# Patient Record
Sex: Female | Born: 1955
Health system: Southern US, Community
[De-identification: ages and names within clinical notes are randomized; demographics above are authoritative.]

## PROBLEM LIST (undated history)

## (undated) DIAGNOSIS — T7840XA Allergy, unspecified, initial encounter: Secondary | ICD-10-CM

## (undated) DIAGNOSIS — G4730 Sleep apnea, unspecified: Secondary | ICD-10-CM

## (undated) DIAGNOSIS — I10 Essential (primary) hypertension: Secondary | ICD-10-CM

## (undated) DIAGNOSIS — F329 Major depressive disorder, single episode, unspecified: Secondary | ICD-10-CM

## (undated) DIAGNOSIS — F419 Anxiety disorder, unspecified: Secondary | ICD-10-CM

## (undated) DIAGNOSIS — K219 Gastro-esophageal reflux disease without esophagitis: Secondary | ICD-10-CM

## (undated) DIAGNOSIS — F32A Depression, unspecified: Secondary | ICD-10-CM

## (undated) DIAGNOSIS — E785 Hyperlipidemia, unspecified: Secondary | ICD-10-CM

## (undated) DIAGNOSIS — E079 Disorder of thyroid, unspecified: Secondary | ICD-10-CM

## (undated) HISTORY — DX: Anxiety disorder, unspecified: F41.9

## (undated) HISTORY — PX: POLYPECTOMY: SHX149

## (undated) HISTORY — DX: Allergy, unspecified, initial encounter: T78.40XA

## (undated) HISTORY — DX: Essential (primary) hypertension: I10

## (undated) HISTORY — DX: Major depressive disorder, single episode, unspecified: F32.9

## (undated) HISTORY — DX: Gastro-esophageal reflux disease without esophagitis: K21.9

## (undated) HISTORY — PX: ABDOMINAL HYSTERECTOMY: SHX81

## (undated) HISTORY — DX: Disorder of thyroid, unspecified: E07.9

## (undated) HISTORY — DX: Sleep apnea, unspecified: G47.30

## (undated) HISTORY — DX: Hyperlipidemia, unspecified: E78.5

## (undated) HISTORY — DX: Depression, unspecified: F32.A

---

## 1998-11-02 ENCOUNTER — Other Ambulatory Visit: Admission: RE | Admit: 1998-11-02 | Discharge: 1998-11-02 | Payer: Self-pay | Admitting: Obstetrics and Gynecology

## 2000-04-09 ENCOUNTER — Encounter: Payer: Self-pay | Admitting: Emergency Medicine

## 2000-04-09 ENCOUNTER — Emergency Department (HOSPITAL_COMMUNITY): Admission: EM | Admit: 2000-04-09 | Discharge: 2000-04-09 | Payer: Self-pay | Admitting: Emergency Medicine

## 2000-04-10 ENCOUNTER — Emergency Department (HOSPITAL_COMMUNITY): Admission: EM | Admit: 2000-04-10 | Discharge: 2000-04-11 | Payer: Self-pay | Admitting: Emergency Medicine

## 2001-04-04 ENCOUNTER — Emergency Department (HOSPITAL_COMMUNITY): Admission: EM | Admit: 2001-04-04 | Discharge: 2001-04-05 | Payer: Self-pay | Admitting: Emergency Medicine

## 2001-05-14 ENCOUNTER — Other Ambulatory Visit: Admission: RE | Admit: 2001-05-14 | Discharge: 2001-05-14 | Payer: Self-pay | Admitting: Internal Medicine

## 2001-05-27 ENCOUNTER — Encounter: Payer: Self-pay | Admitting: Internal Medicine

## 2001-05-27 ENCOUNTER — Encounter: Admission: RE | Admit: 2001-05-27 | Discharge: 2001-05-27 | Payer: Self-pay | Admitting: Internal Medicine

## 2001-11-30 ENCOUNTER — Inpatient Hospital Stay (HOSPITAL_COMMUNITY): Admission: AD | Admit: 2001-11-30 | Discharge: 2001-12-02 | Payer: Self-pay | Admitting: Internal Medicine

## 2001-12-01 ENCOUNTER — Encounter: Payer: Self-pay | Admitting: Internal Medicine

## 2001-12-02 ENCOUNTER — Encounter: Payer: Self-pay | Admitting: Cardiology

## 2002-05-28 ENCOUNTER — Encounter: Payer: Self-pay | Admitting: Internal Medicine

## 2002-05-28 ENCOUNTER — Encounter: Admission: RE | Admit: 2002-05-28 | Discharge: 2002-05-28 | Payer: Self-pay | Admitting: Internal Medicine

## 2002-07-15 HISTORY — PX: COLONOSCOPY: SHX174

## 2003-05-02 ENCOUNTER — Ambulatory Visit (HOSPITAL_COMMUNITY): Admission: RE | Admit: 2003-05-02 | Discharge: 2003-05-02 | Payer: Self-pay | Admitting: Gastroenterology

## 2003-05-31 ENCOUNTER — Encounter: Admission: RE | Admit: 2003-05-31 | Discharge: 2003-05-31 | Payer: Self-pay | Admitting: Internal Medicine

## 2003-10-26 ENCOUNTER — Emergency Department (HOSPITAL_COMMUNITY): Admission: EM | Admit: 2003-10-26 | Discharge: 2003-10-26 | Payer: Self-pay | Admitting: Family Medicine

## 2003-10-30 ENCOUNTER — Emergency Department (HOSPITAL_COMMUNITY): Admission: AD | Admit: 2003-10-30 | Discharge: 2003-10-30 | Payer: Self-pay | Admitting: Family Medicine

## 2004-03-10 ENCOUNTER — Emergency Department (HOSPITAL_COMMUNITY): Admission: EM | Admit: 2004-03-10 | Discharge: 2004-03-11 | Payer: Self-pay | Admitting: Emergency Medicine

## 2004-06-19 ENCOUNTER — Encounter: Admission: RE | Admit: 2004-06-19 | Discharge: 2004-06-19 | Payer: Self-pay | Admitting: Internal Medicine

## 2005-02-22 ENCOUNTER — Ambulatory Visit: Payer: Self-pay | Admitting: Internal Medicine

## 2005-02-22 ENCOUNTER — Ambulatory Visit (HOSPITAL_COMMUNITY): Admission: RE | Admit: 2005-02-22 | Discharge: 2005-02-22 | Payer: Self-pay | Admitting: Gastroenterology

## 2005-03-06 ENCOUNTER — Ambulatory Visit: Payer: Self-pay | Admitting: Gastroenterology

## 2005-04-23 ENCOUNTER — Ambulatory Visit: Payer: Self-pay | Admitting: Internal Medicine

## 2005-07-03 ENCOUNTER — Encounter: Admission: RE | Admit: 2005-07-03 | Discharge: 2005-07-03 | Payer: Self-pay | Admitting: Internal Medicine

## 2006-08-01 ENCOUNTER — Encounter: Admission: RE | Admit: 2006-08-01 | Discharge: 2006-08-01 | Payer: Self-pay | Admitting: Internal Medicine

## 2007-08-04 ENCOUNTER — Encounter: Admission: RE | Admit: 2007-08-04 | Discharge: 2007-08-04 | Payer: Self-pay | Admitting: Internal Medicine

## 2007-11-15 ENCOUNTER — Emergency Department (HOSPITAL_COMMUNITY): Admission: EM | Admit: 2007-11-15 | Discharge: 2007-11-15 | Payer: Self-pay | Admitting: Emergency Medicine

## 2008-08-23 ENCOUNTER — Encounter: Admission: RE | Admit: 2008-08-23 | Discharge: 2008-08-23 | Payer: Self-pay | Admitting: Internal Medicine

## 2009-08-24 ENCOUNTER — Encounter: Admission: RE | Admit: 2009-08-24 | Discharge: 2009-08-24 | Payer: Self-pay | Admitting: Internal Medicine

## 2010-08-08 ENCOUNTER — Other Ambulatory Visit: Payer: Self-pay | Admitting: Internal Medicine

## 2010-08-08 DIAGNOSIS — Z1239 Encounter for other screening for malignant neoplasm of breast: Secondary | ICD-10-CM

## 2010-09-12 ENCOUNTER — Ambulatory Visit
Admission: RE | Admit: 2010-09-12 | Discharge: 2010-09-12 | Disposition: A | Discharge status: Home / Self Care | Payer: BC Managed Care – PPO | Source: Ambulatory Visit | Attending: Internal Medicine | Admitting: Internal Medicine

## 2010-09-12 DIAGNOSIS — Z1239 Encounter for other screening for malignant neoplasm of breast: Secondary | ICD-10-CM

## 2010-11-30 NOTE — Consult Note (Signed)
[place]. [facility]  Patient:    Tamara West, Tamara West                      MRN: [id number] Proc. Date: 04/09/00 Attending:  Genene Churn. Love, M.D.                          Consultation Report  PATIENT ADDRESS:  [address] [place], [place] [address].  DATE OF BIRTH:  February 16, 1956  REASON FOR CONSULTATION:  This 55 year old right-handed black married female has a three-year history of four or five episodes per year of lying down and developing left arm and leg numbness.  I am asked to see her for evaluation of the recurrent numbness.  HISTORY OF PRESENT ILLNESS:  Tamara West has a known history of high blood pressure and discontinued her antihypertensive medications approximately four months ago.  She had been followed by Dr. Andi Devon at that time.  Last night, she was boiling a chicken and used Mahatma rice with vermicelli in it. She noted the onset of hives and had itching and raised skin lesions during the night.  This morning, she took a Benadryl, became sleepy and laid down, and after she got up, she had left arm and left leg numbness without West numbness, headache, trunk, or chest numbness.  The symptoms resolved in approximately 30 minutes after she was walking around.  She noted no weakness. There has been no history of amaurosis fugax, double vision, hiccups, swallowing problems, etc.  She has no known history of migraine headaches with the episodes, but states that she has had some headaches.  She denies any other serious neurologic condition.  She is currently not taking any medications.  PAST MEDICAL HISTORY:  Significant for panic attacks, depression, and longstanding hypertension.  PHYSICAL EXAMINATION:  GENERAL:  Revealed a well-developed, pleasant, black female in no acute distress.  VITAL SIGNS:  Blood pressure sitting right and left arm 160/80 and 150/80 with a heart rate of 84 and regular.  No bruits were  heard.  NECK:  Supple.  NEUROLOGIC:  Mental status revealed she was alert, oriented x 3, and followed three-step commands.  Her cranial nerve examination revealed visual fields to be full, the disks are flat, the extraocular movements are full, and corneals are present.  There was no facial motor asymmetry.  Hearing was present with air conduction greater than bone conduction.  Tongue was midline, the uvula was midline, gags were present.  Sternocleidomastoid and trapezius and all motor examination revealed good strength in the upper and lower extremities without any evidence of drift.  Her coordination testing revealed finger-to-nose and heel-to-shin to be well done.  The deep tendon reflexes were 2+ and plantar responses were downgoing.  LABORATORY DATA:  Revealed a glucose of 107, BUN 9, sodium 142, potassium 3.8, chloride 107, CO2 content 24.  Hemoglobin and hematocrit were 14.9 and 42% respectively.  IMPRESSION: 1. Recurrent transient left-sided numbness, occurring four to five times per    year over the last three years, possibly representing migraine phenomenon,    code 782.0. 2. Allergy with hives. 3. Hypertension, code 796.2.  PLAN:  The plan at this time is to obtain a CT scan without contrast enhancement and consider aspirin therapy, depending on the above. DD:  04/09/00 TD:  04/10/00 Job: [id number]

## 2010-11-30 NOTE — Op Note (Signed)
   NAMEJOSSALYN, Tamara West                         ACCOUNT NO.:  [id number]   MEDICAL RECORD NO.:  [id number]                   PATIENT TYPE:  AMB   LOCATION:  ENDO                                 FACILITY:  [facility]   PHYSICIAN:  Anselmo Rod, M.D.               DATE OF BIRTH:  07-28-1955   DATE OF PROCEDURE:  05/02/2003  DATE OF DISCHARGE:                                 OPERATIVE REPORT   PROCEDURE:  Screening colonoscopy.   ENDOSCOPIST:  Anselmo Rod, M.D.   INSTRUMENT USED:  Olympus video colonoscope.   INDICATIONS FOR PROCEDURE:  A 55 year old African-American female with a  history of bright red blood per rectum, rule out colonic polyps, masses,  hemorrhoids, etc.   PREPROCEDURE PREPARATION:  Informed consent was procured from the patient.  The patient was fasted for eight hours prior to the procedure and prepped  with a bottle of magnesium citrate and a gallon of GoLYTELY the night prior  to the procedure.   PREPROCEDURE PHYSICAL EXAMINATION:  VITAL SIGNS: Stable.  NECK:  Supple.  CHEST:  Clear to auscultation. S1 and S2 regular.  ABDOMEN:  Soft with normal bowel sounds.   DESCRIPTION OF PROCEDURE:  The patient was placed in the left lateral  decubitus position and sedated with 70 mg of Demerol and 7 mg of Versed  intravenously.  Once the patient was adequately sedated and maintained on  low flow oxygen and continuous cardiac monitoring, the Olympus video  colonoscope was advanced from the rectum to the cecum and terminal ileum.  There was some residual stool in the colon.  Multiple washings were done.  No masses, polyps, erosions, ulcerations, or diverticula were seen.  Small  internal hemorrhoids were appreciated on retroflexion in the rectum.  The  examination was normal up to the terminal ileum.  The appendiceal orifice  and ileocecal valve were clearly visualized and photographed.   IMPRESSION:  1. Normal colonoscopy up to the terminal ileum except for small  internal     hemorrhoids.  2. No masses, polyps, or diverticula were seen.    RECOMMENDATIONS:  Continue high fiber diet with liberal fluid intake.  Repeat CRC screening in the next 10 years unless the patient develops any  abnormal symptoms in the interim.  Outpatient follow-up in the next two  weeks or earlier if needed.                                               Anselmo Rod, M.D.    JNM/MEDQ  D:  05/02/2003  T:  05/02/2003  Job:  [id number]   cc:   Merlene Laughter. Renae Gloss, M.D.  [address]  [address]  [place]  [place] [address]  Fax: [phone]

## 2010-11-30 NOTE — Discharge Summary (Signed)
[place]. [facility]  Patient:    Tamara West, Tamara West                      MRN: [id number] Attending:  Genene Churn. Love, M.D.                           Discharge Summary  DATE OF BIRTH:  17-Jun-1956.  ADDENDUM:  IMAGING STUDIES:  CT scan of the brain without contrast enhancement showed no definite abnormality.  IMPRESSION: 1. Paresthesia of unknown etiology, code 782.0. 2. Allergy reaction.  PLAN:  Follow her up as an outpatient.  She is to take one aspirin a day and restart her antihypertensive medications, which she discontinued.  The possibility of sensory seizure is raised or demyelinating disorder, but both seem unlikely.  Plan is to follow up as an outpatient. DD:  04/09/00 TD:  04/10/00 Job: [id number]

## 2010-11-30 NOTE — Procedures (Signed)
[place]  Patient:    Tamara West, OSORTO Visit Number: [id number] MRN: [id number]          Service Type: MED Location: 2000 2004 01 Attending Physician:  Andi Devon Dictated by:   Sherral Hammers, M.D. Proc. Date: 12/02/01 Admit Date:  11/30/2001 Discharge Date: 12/02/2001   CC:         Cala Bradford R. Renae Gloss, M.D.   Stress Test  REFERRING PHYSICIAN:  Merlene Laughter. Renae Gloss, M.D.  INDICATIONS:  [name] was admitted to [facility] with complaints of chest discomfort and palpitations.  She subsequently had negative cardiac enzymes and in view of her risk factors of hypertension and hyperlipidemia, she is referred for nuclear stress test today.  PERSANTINE TECHNETIUM--45m CARDIOLITE MYOCARDIAL PERFUSION STUDY:  The patient performed the standard rest/pharmacologic stress protocol.  ELECTROCARDIOGRAPHIC/HEMODYNAMIC DATA:  The resting blood pressure was 153/96 with a resting pulse of 92 beats per minute.  Blood pressure decreased to a nadir of 123/80.  The patient did complain of some head tingling with chest pressure which resolved with administration of IV aminophylline per protocol. Baseline 12-lead electrocardiogram revealed normal sinus rhythm with nonspecific ST changes.  She did experience some mild (less than 1 mm) ST depression inferiorly and anterolaterally.  She tolerated the procedure well without immediate complications.  IMPRESSION: 1. EKG negative for ischemia. 2. Cineangiographic images are pending.Dictated by:   Sherral Hammers, M.D.  Attending Physician:  Andi Devon DD:  12/02/01 TD:  12/04/01 Job: [id number]

## 2010-11-30 NOTE — H&P (Signed)
[place]. [facility]  Patient:    Tamara West, Tamara West Visit Number: [id number] MRN: [id number]          Service Type: MED Location: 2000 2004 01 Attending Physician:  Alva Garnet. Dictated by:   Merlene Laughter Renae Gloss, M.D. Admit Date:  11/30/2001                           History and Physical  CHIEF COMPLAINT:  Chest pain.  HISTORY OF PRESENT ILLNESS:  Ms. Tamara West is a 55 year old lady who presents today with intermittent episodes of chest pain.  She also reports association of heart palpitations and numbness in left arm.  She denies shortness of breath, nausea, vomiting, or diaphoresis with her symptoms, however.  She has no other acute constitutional or systemic complaints other than occasional leg nocturnal leg cramps.  ALLERGIES:  PENICILLIN which causes swelling.  MEDICATIONS:  Maxzide 37.5/25 one p.o. q.d.  PAST MEDICAL HISTORY:  Hypertension, hyperlipidemia.  FAMILY HISTORY:  Significant for heart attack in father who is deceased, hypertension in father and in siblings, stomach cancer and diabetes in grandmother.  SOCIAL HISTORY:  Ms. Tamara West is married and is employed as a Surveyor, mining.  She denies tobacco, alcohol, or drugs of abuse.  REVIEW OF SYSTEMS:  As per patient history assessment and HPI, otherwise negative.  Greater than 10 systems are reviewed.  PHYSICAL EXAMINATION  GENERAL:  Well-developed, well-nourished black female in no acute distress.  VITAL SIGNS:  Blood pressure 130/80, pulse 76, temperature 98.9, respirations 20.  HEENT:  TMs within normal limits bilaterally.  No oropharyngeal lesions. PERRLA.  NECK:  Supple.  No masses.  Carotids 2+.  No bruits.  LUNGS:  Clear to auscultation bilaterally.  HEART:  S1, S2.  Regular rate, rhythm.  No murmur, rub, or gallop.  ABDOMEN:  Soft, nontender, nondistended.  Positive bowel sounds.  EXTREMITIES:  No clubbing, cyanosis, edema.  Pulses  2+ throughout.  NEUROLOGIC:  Alert and oriented x3.  Cranial nerves intact.  ASSESSMENT AND PLAN: 1. Chest pain with palpitations.  Ms. Tamara West has several cardiac risk    factors including hypertension, postmenopause, family history, and    hyperlipidemia.  Her symptoms are concerning for angina.  She will be    admitted to telemetry and observed closely.  Serial cardiac enzymes will be    obtained as well.  If her enzymes are unremarkable, a Cardiolite stress    test will be obtained.  However, if she rules in for myocardial infarction,    of course, a cardiology consult will be requested.  Thyroid disease may be    a possible etiology of her palpitations.  Thyroid function tests will be    obtained during this admission.  Her symptoms may be exacerbated by    anxiety. 2. Hypertension.  This has been well controlled.  She will continue with her    outpatient medical regimen. 3. Hyperlipidemia.  A fasting lipid panel will be obtained.  Ms. Tamara West was    unable to tolerate Lipitor.  She was started on Zocor several months ago.    She states that she has been compliant with this medication.  Her    anticholesterol medication may need adjustment during this admission. Dictated by:   Merlene Laughter Renae Gloss, M.D. Attending Physician:  Andi Devon R. DD:  11/30/01 TD:  12/01/01 Job: [id number]

## 2010-11-30 NOTE — Discharge Summary (Signed)
[place]. [facility]  Patient:    Tamara West, Tamara West Visit Number: [id number] MRN: [id number]          Service Type: MED Location: 2000 2004 01 Attending Physician:  Alva Garnet. Dictated by:   Merlene Laughter Renae Gloss, M.D. Admit Date:  11/30/2001 Discharge Date: 12/02/2001                             Discharge Summary  DISCHARGE DIAGNOSES: 1. Chest pain. 2. Hypertension. 3. Hyperlipidemia. 4. Palpitations.  HISTORY AND HOSPITAL COURSE:  The patient was admitted for further evaluation and treatment of mid sternal chest pain associated with heart palpitations and numbness of her left arm.  Her telemetry readings were normal sinus rhythm throughout her admission on electrocardiogram and furthermore did not show any acute ischemic changes, however given her risk factors a cardiac consultation was obtained.  The patient ruled out for a myocardial infarction and a Persantine Cardiolite test was unremarkable.  It is unlikely that the etiology for the patients chest pain is cardiac-related.  Other concerns would include a gastrointestinal cause such as acid peptic disease or musculoskeletal source such as costochondritis or muscle strain.  The patient had no further chest pain throughout her hospitalization.  Other concerns raised during the patients admission were low blood pressure readings as well as hyperkalemia.  It was thought that this was secondary to her Maxzide.  Her Maxzide was discontinued, however, she will continue taking her hydrochlorothiazide 12.5 mg p.o. q.d.  DISCHARGE MEDICATIONS: 1. Hydrochlorothiazide 25 mg tablets, one half p.o. q.d. 2. Aspirin 325 mg one p.o. q.d.  FOLLOW UP:  The patient will be seen by Dr. Renae Gloss within two weeks following discharge. Dictated by:   Merlene Laughter Renae Gloss, M.D. Attending Physician:  Andi Devon R. DD:  01/07/02 TD:  01/08/02 Job: [phone] [id number]

## 2011-09-24 ENCOUNTER — Other Ambulatory Visit: Payer: Self-pay | Admitting: Internal Medicine

## 2011-09-24 DIAGNOSIS — Z1231 Encounter for screening mammogram for malignant neoplasm of breast: Secondary | ICD-10-CM

## 2011-10-07 ENCOUNTER — Ambulatory Visit
Admission: RE | Admit: 2011-10-07 | Discharge: 2011-10-07 | Disposition: A | Discharge status: Home / Self Care | Payer: BC Managed Care – PPO | Source: Ambulatory Visit | Attending: Internal Medicine | Admitting: Internal Medicine

## 2011-10-07 DIAGNOSIS — Z1231 Encounter for screening mammogram for malignant neoplasm of breast: Secondary | ICD-10-CM

## 2012-01-21 ENCOUNTER — Ambulatory Visit (INDEPENDENT_AMBULATORY_CARE_PROVIDER_SITE_OTHER): Payer: BC Managed Care – PPO | Admitting: Emergency Medicine

## 2012-01-21 VITALS — BP 130/84 | HR 88 | Temp 98.1°F | Resp 16 | Ht 60.5 in | Wt 186.4 lb

## 2012-01-21 DIAGNOSIS — IMO0002 Reserved for concepts with insufficient information to code with codable children: Secondary | ICD-10-CM

## 2012-01-21 DIAGNOSIS — S86919A Strain of unspecified muscle(s) and tendon(s) at lower leg level, unspecified leg, initial encounter: Secondary | ICD-10-CM

## 2012-01-21 MED ORDER — NAPROXEN SODIUM 550 MG PO TABS: 550.0000 mg | ORAL_TABLET | Freq: Two times a day (BID) | ORAL | Status: AC

## 2012-01-21 NOTE — Progress Notes (Signed)
   Date:  01/21/2012   Name:  Tamara West   DOB:  [birth year]   MRN:  [id number]  PCP:  No primary provider on file.    Chief Complaint: Calf Pain   History of Present Illness:  Tamara West is a 56 y.o. very pleasant female patient who presents with the following:  No history of injury. Says she was at church on Sunday and felt a pop in the calf of her right leg.  No overuse.  Since has been limping due to pain and swelling.  Denies risks for DVT.  There is no problem list on file for this patient.  No past medical history on file. No past surgical history on file. History  Substance Use Topics  . Smoking status: Never Smoker   . Smokeless tobacco: Not on file  . Alcohol Use: No   No family history on file. Allergies  Allergen Reactions  . Penicillins Swelling  . Sulfa Antibiotics Other (See Comments)    knots    Medication list has been reviewed and updated.  Current Outpatient Prescriptions on File Prior to Visit  Medication Sig Dispense Refill  . simvastatin (ZOCOR) 40 MG tablet Take 40 mg by mouth every evening.      . valsartan-hydrochlorothiazide (DIOVAN-HCT) 80-12.5 MG per tablet Take 1 tablet by mouth daily.        Review of Systems:  As per HPI, otherwise negative.    Physical Examination: Filed Vitals:   01/21/12 1611  BP: 130/84  Pulse: 88  Temp: 98.1 F (36.7 C)  Resp: 16   Filed Vitals:   01/21/12 1611  Height: 5' 0.5" (1.537 m)  Weight: 186 lb 6.4 oz (84.55 kg)   Body mass index is 35.80 kg/(m^2). Ideal Body Weight: Weight in (lb) to have BMI = 25: 129.9    GEN: WDWN, NAD, Non-toxic, Alert & Oriented x 3 HEENT: Atraumatic, Normocephalic.  Ears and Nose: No external deformity. EXTR: No clubbing/cyanosis/edema NEURO: Normal gait.  PSYCH: Normally interactive. Conversant. Not depressed or anxious appearing.  Calm demeanor.  Right calf tender.  Full ROM lower ext.  No deformity or ecchymosis pulses intact.   Calf muscle soft.  No  edema.  EKG / Labs / Xrays: None available at time of encounter  Assessment and Plan: Muscle strain calf Crutches Heat Anaprox Follow up as needed  Carmelina Dane, MD

## 2012-09-07 ENCOUNTER — Other Ambulatory Visit: Payer: Self-pay | Admitting: Internal Medicine

## 2012-09-07 DIAGNOSIS — Z1231 Encounter for screening mammogram for malignant neoplasm of breast: Secondary | ICD-10-CM

## 2012-10-07 ENCOUNTER — Ambulatory Visit
Admission: RE | Admit: 2012-10-07 | Discharge: 2012-10-07 | Disposition: A | Discharge status: Home / Self Care | Payer: BC Managed Care – PPO | Source: Ambulatory Visit | Attending: Internal Medicine | Admitting: Internal Medicine

## 2012-10-07 DIAGNOSIS — Z1231 Encounter for screening mammogram for malignant neoplasm of breast: Secondary | ICD-10-CM

## 2013-09-01 ENCOUNTER — Other Ambulatory Visit: Payer: Self-pay

## 2013-09-01 DIAGNOSIS — Z1231 Encounter for screening mammogram for malignant neoplasm of breast: Secondary | ICD-10-CM

## 2014-01-25 ENCOUNTER — Encounter (INDEPENDENT_AMBULATORY_CARE_PROVIDER_SITE_OTHER): Payer: Self-pay

## 2014-01-25 ENCOUNTER — Ambulatory Visit
Admission: RE | Admit: 2014-01-25 | Discharge: 2014-01-25 | Disposition: A | Discharge status: Home / Self Care | Payer: BC Managed Care – PPO | Source: Ambulatory Visit

## 2014-01-25 DIAGNOSIS — Z1231 Encounter for screening mammogram for malignant neoplasm of breast: Secondary | ICD-10-CM

## 2014-01-27 ENCOUNTER — Other Ambulatory Visit: Payer: Self-pay | Admitting: Internal Medicine

## 2014-01-27 DIAGNOSIS — R928 Other abnormal and inconclusive findings on diagnostic imaging of breast: Secondary | ICD-10-CM

## 2014-02-03 ENCOUNTER — Ambulatory Visit
Admission: RE | Admit: 2014-02-03 | Discharge: 2014-02-03 | Disposition: A | Discharge status: Home / Self Care | Payer: BC Managed Care – PPO | Source: Ambulatory Visit | Attending: Internal Medicine | Admitting: Internal Medicine

## 2014-02-03 DIAGNOSIS — R928 Other abnormal and inconclusive findings on diagnostic imaging of breast: Secondary | ICD-10-CM

## 2014-07-13 ENCOUNTER — Encounter: Payer: Self-pay | Admitting: Gastroenterology

## 2014-11-06 ENCOUNTER — Ambulatory Visit (INDEPENDENT_AMBULATORY_CARE_PROVIDER_SITE_OTHER): Payer: BC Managed Care – PPO | Admitting: Family Medicine

## 2014-11-06 ENCOUNTER — Ambulatory Visit (INDEPENDENT_AMBULATORY_CARE_PROVIDER_SITE_OTHER): Payer: BC Managed Care – PPO

## 2014-11-06 VITALS — BP 134/80 | HR 99 | Temp 98.5°F | Resp 16 | Ht 62.0 in | Wt 184.8 lb

## 2014-11-06 DIAGNOSIS — R1013 Epigastric pain: Secondary | ICD-10-CM

## 2014-11-06 DIAGNOSIS — B889 Infestation, unspecified: Secondary | ICD-10-CM

## 2014-11-06 DIAGNOSIS — R21 Rash and other nonspecific skin eruption: Secondary | ICD-10-CM

## 2014-11-06 DIAGNOSIS — K5900 Constipation, unspecified: Secondary | ICD-10-CM | POA: Diagnosis not present

## 2014-11-06 LAB — POCT CBC
Granulocyte percent: 82.7 %G — AB (ref 37–80)
HCT, POC: 39.1 % (ref 37.7–47.9)
Hemoglobin: 12.9 g/dL (ref 12.2–16.2)
Lymph, poc: 1.2 (ref 0.6–3.4)
MCH, POC: 28.6 pg (ref 27–31.2)
MCHC: 32.9 g/dL (ref 31.8–35.4)
MCV: 86.8 fL (ref 80–97)
MID (CBC): 0.5 (ref 0–0.9)
MPV: 8.4 fL (ref 0–99.8)
POC Granulocyte: 8.1 — AB (ref 2–6.9)
POC LYMPH PERCENT: 12.6 %L (ref 10–50)
POC MID %: 4.7 %M (ref 0–12)
Platelet Count, POC: 247 10*3/uL (ref 142–424)
RBC: 4.5 M/uL (ref 4.04–5.48)
RDW, POC: 13 %
WBC: 9.8 10*3/uL (ref 4.6–10.2)

## 2014-11-06 LAB — COMPREHENSIVE METABOLIC PANEL
ALK PHOS: 78 U/L (ref 39–117)
ALT: 19 U/L (ref 0–35)
AST: 21 U/L (ref 0–37)
Albumin: 4.1 g/dL (ref 3.5–5.2)
BUN: 10 mg/dL (ref 6–23)
CO2: 22 mEq/L (ref 19–32)
Calcium: 9.6 mg/dL (ref 8.4–10.5)
Chloride: 101 mEq/L (ref 96–112)
Creat: 0.92 mg/dL (ref 0.50–1.10)
Glucose, Bld: 97 mg/dL (ref 70–99)
Potassium: 3.8 mEq/L (ref 3.5–5.3)
SODIUM: 135 meq/L (ref 135–145)
Total Bilirubin: 0.4 mg/dL (ref 0.2–1.2)
Total Protein: 7.9 g/dL (ref 6.0–8.3)

## 2014-11-06 LAB — POCT SKIN KOH: Skin KOH, POC: NEGATIVE

## 2014-11-06 LAB — POCT URINALYSIS DIPSTICK
Bilirubin, UA: NEGATIVE
Glucose, UA: NEGATIVE
Ketones, UA: NEGATIVE
Leukocytes, UA: NEGATIVE
Nitrite, UA: NEGATIVE
RBC UA: NEGATIVE
Spec Grav, UA: 1.02
Urobilinogen, UA: 0.2
pH, UA: 5.5

## 2014-11-06 LAB — POCT UA - MICROSCOPIC ONLY
Casts, Ur, LPF, POC: NEGATIVE
Crystals, Ur, HPF, POC: NEGATIVE
Yeast, UA: NEGATIVE

## 2014-11-06 LAB — POCT GLYCOSYLATED HEMOGLOBIN (HGB A1C): Hemoglobin A1C: 5.7

## 2014-11-06 MED ORDER — PERMETHRIN 1 % EX LOTN: 1.0000 "application " | TOPICAL_LOTION | Freq: Once | CUTANEOUS | Status: DC

## 2014-11-06 MED ORDER — POLYETHYLENE GLYCOL 3350 17 GM/SCOOP PO POWD: 17.0000 g | Freq: Three times a day (TID) | ORAL | Status: DC

## 2014-11-06 MED ORDER — DOCUSATE SODIUM 100 MG PO CAPS: 100.0000 mg | ORAL_CAPSULE | Freq: Two times a day (BID) | ORAL | Status: DC

## 2014-11-06 NOTE — Progress Notes (Signed)
11/06/2014 at 5:48 PM  Tamara West / DOB: 09-29-1955 / MRN: [id number]  The patient  does not have a problem list on file.  SUBJECTIVE  Chief complaint: Abdominal Pain; Nausea; Fatigue; Generalized Body Aches; and Rash   Patient here complaining of RUQ abdominal pain that started last Tuesday.  She reports a history of constipation, but reports that this has been better lately. She has a history of GERD for "years" and usually uses Gaviscon which works well for her. She had a non bloody bowel movement this morning.  She has taken Malox without relief of her symptoms.  She denies drinking, chest pain, diaphoresis, shortness of breath.  Her GERD symptoms have been better lately.   She complains of myalgia that started on Wednesday after a vigorous exercise routine, in which she did much more than normal.  She complains of itchy rash that started after she came into contact with someone who has "bedbugs" which started two days ago. She complains of the rash on her hands, wrist, and legs.    She  has a past medical history of Depression and Anxiety.    Medications reviewed and updated by myself where necessary, and exist elsewhere in the encounter.   Tamara West is allergic to penicillins and sulfa antibiotics. She  reports that she has never smoked. She does not have any smokeless tobacco history on file. She reports that she does not drink alcohol or use illicit drugs. She  reports that she does not engage in sexual activity. The patient  has past surgical history that includes Abdominal hysterectomy.  Her family history is not on file.  Review of Systems  Constitutional: Negative for fever and chills.  Respiratory: Negative for cough.   Cardiovascular: Negative for chest pain and palpitations.  Gastrointestinal: Negative for nausea.  Skin: Positive for itching and rash.  Neurological: Negative for headaches.    OBJECTIVE  Her  height is 5\' 2"  (1.575 m) and weight is 184 lb 12.8 oz  (83.825 kg). Her oral temperature is 98.5 F (36.9 C). Her blood pressure is 134/80 and her pulse is 99. Her respiration is 16 and oxygen saturation is 98%.  The patient's body mass index is 33.79 kg/(m^2).  Physical Exam  Constitutional: She is oriented to person, place, and time. She appears well-developed and well-nourished.  Cardiovascular: Normal rate.   Respiratory: No respiratory distress. She has no wheezes.  GI: She exhibits no distension and no mass. There is tenderness (RUQ, mild). There is no rebound and no guarding.  Musculoskeletal: Normal range of motion.  Neurological: She is alert and oriented to person, place, and time. No cranial nerve deficit.  Skin: Skin is warm and dry. She is not diaphoretic. No pallor.  Generalized pruritic maculopapular rash on the web spaces, arms, legs and axiallae.    Psychiatric: She has a normal mood and affect.    Results for orders placed or performed in visit on 11/06/14 (from the past 24 hour(s))  POCT urinalysis dipstick     Status: None   Collection Time: 11/06/14 10:09 AM  Result Value Ref Range   Color, UA yellow    Clarity, UA cloudy    Glucose, UA neg    Bilirubin, UA neg    Ketones, UA neg    Spec Grav, UA 1.020    Blood, UA neg    pH, UA 5.5    Protein, UA trace    Urobilinogen, UA 0.2    Nitrite, UA  neg    Leukocytes, UA Negative   POCT UA - Microscopic Only     Status: None   Collection Time: 11/06/14 10:09 AM  Result Value Ref Range   WBC, Ur, HPF, POC 1-3    RBC, urine, microscopic 0-2    Bacteria, U Microscopic trace    Mucus, UA 1+    Epithelial cells, urine per micros 3-8    Crystals, Ur, HPF, POC neg    Casts, Ur, LPF, POC neg    Yeast, UA neg   POCT CBC     Status: Abnormal   Collection Time: 11/06/14 10:09 AM  Result Value Ref Range   WBC 9.8 4.6 - 10.2 K/uL   Lymph, poc 1.2 0.6 - 3.4   POC LYMPH PERCENT 12.6 10 - 50 %L   MID (cbc) 0.5 0 - 0.9   POC MID % 4.7 0 - 12 %M   POC Granulocyte 8.1 (A) 2 -  6.9   Granulocyte percent 82.7 (A) 37 - 80 %G   RBC 4.50 4.04 - 5.48 M/uL   Hemoglobin 12.9 12.2 - 16.2 g/dL   HCT, POC 39.1 37.7 - 47.9 %   MCV 86.8 80 - 97 fL   MCH, POC 28.6 27 - 31.2 pg   MCHC 32.9 31.8 - 35.4 g/dL   RDW, POC 13.0 %   Platelet Count, POC 247 142 - 424 K/uL   MPV 8.4 0 - 99.8 fL  POCT glycosylated hemoglobin (Hb A1C)     Status: None   Collection Time: 11/06/14 10:09 AM  Result Value Ref Range   Hemoglobin A1C 5.7   POCT Skin KOH     Status: None   Collection Time: 11/06/14 10:19 AM  Result Value Ref Range   Skin KOH, POC Negative    [facility] reading (PRIMARY) by  Dr. Elder Cyphers: Large stool burden in the ascending colon and hepatic flexure. Negative for free air.   ASSESSMENT & PLAN  Tamara West was seen today for abdominal pain, nausea, fatigue, generalized body aches and rash.  Diagnoses and all orders for this visit:  Epigastric pain: Doubt intra-abdominal process.  Likely secondary to constipation problem.  Orders: -     POCT urinalysis dipstick -     POCT UA - Microscopic Only -     POCT CBC -     POCT glycosylated hemoglobin (Hb A1C) -     Comprehensive metabolic panel -     DG Abd Acute W/Chest -     DG Abd 2 Views; Future -     Comprehensive metabolic panel -     Lipase  Rash and nonspecific skin eruption: Managed by the last problem.   Orders: -     POCT Skin KOH  Constipation, unspecified constipation type Orders: -     docusate sodium (COLACE) 100 MG capsule; Take 1 capsule (100 mg total) by mouth 2 (two) times daily. -     polyethylene glycol powder (GLYCOLAX/MIRALAX) powder; Take 17 g by mouth 3 (three) times daily. Take for the next five days.  Mite infestation Orders: -     permethrin (PERMETHRIN LICE TREATMENT) 1 % lotion; Apply 1 application topically once. Shampoo, rinse and towel dry hair, saturate hair and scalp with permethrin. Rinse after 10 min; repeat in 1 week if needed    The patient was advised to call or come back to clinic if  she does not see an improvement in symptoms, or worsens with the above plan.  Philis Fendt, MHS, PA-C [facility] 11/06/2014 5:48 PM   I reviewed history and management with Mr. Alphonzo Severance, MD

## 2014-11-07 ENCOUNTER — Telehealth: Payer: Self-pay

## 2014-11-07 NOTE — Telephone Encounter (Signed)
lmom to cb. 

## 2014-11-07 NOTE — Telephone Encounter (Signed)
Pt was prescribed an rx for lice and she states that the issue is bed bugs. She wants to know if she should be taking a different rx.

## 2014-11-09 NOTE — Telephone Encounter (Signed)
Spoke with pt. She went to her regular dr yesterday and was checked out.

## 2015-01-17 ENCOUNTER — Other Ambulatory Visit: Payer: Self-pay

## 2015-01-17 DIAGNOSIS — Z1231 Encounter for screening mammogram for malignant neoplasm of breast: Secondary | ICD-10-CM

## 2015-01-30 ENCOUNTER — Ambulatory Visit
Admission: RE | Admit: 2015-01-30 | Discharge: 2015-01-30 | Disposition: A | Discharge status: Home / Self Care | Payer: BC Managed Care – PPO | Source: Ambulatory Visit

## 2015-01-30 DIAGNOSIS — Z1231 Encounter for screening mammogram for malignant neoplasm of breast: Secondary | ICD-10-CM

## 2015-02-09 ENCOUNTER — Encounter: Payer: Self-pay | Admitting: Gastroenterology

## 2015-03-22 ENCOUNTER — Ambulatory Visit (AMBULATORY_SURGERY_CENTER): Payer: Self-pay | Admitting: *Deleted

## 2015-03-22 VITALS — Ht 61.0 in | Wt 174.0 lb

## 2015-03-22 DIAGNOSIS — Z1211 Encounter for screening for malignant neoplasm of colon: Secondary | ICD-10-CM

## 2015-03-22 NOTE — Progress Notes (Signed)
Patient denies any allergies to eggs or soy. Patient denies any problems with anesthesia/sedation. Patient denies any oxygen use at home and does not take any diet/weight loss medications. Patient declined EMMI information at this time.  

## 2015-04-04 ENCOUNTER — Institutional Professional Consult (permissible substitution): Payer: BC Managed Care – PPO | Admitting: Neurology

## 2015-04-05 ENCOUNTER — Ambulatory Visit (AMBULATORY_SURGERY_CENTER): Payer: BC Managed Care – PPO | Admitting: Gastroenterology

## 2015-04-05 ENCOUNTER — Encounter: Payer: Self-pay | Admitting: Gastroenterology

## 2015-04-05 VITALS — BP 123/66 | HR 65 | Temp 98.5°F | Resp 14 | Ht 62.0 in | Wt 185.0 lb

## 2015-04-05 DIAGNOSIS — Z1211 Encounter for screening for malignant neoplasm of colon: Secondary | ICD-10-CM | POA: Diagnosis not present

## 2015-04-05 DIAGNOSIS — D123 Benign neoplasm of transverse colon: Secondary | ICD-10-CM | POA: Diagnosis not present

## 2015-04-05 DIAGNOSIS — D125 Benign neoplasm of sigmoid colon: Secondary | ICD-10-CM

## 2015-04-05 DIAGNOSIS — D120 Benign neoplasm of cecum: Secondary | ICD-10-CM | POA: Diagnosis not present

## 2015-04-05 DIAGNOSIS — D124 Benign neoplasm of descending colon: Secondary | ICD-10-CM

## 2015-04-05 MED ORDER — SODIUM CHLORIDE 0.9 % IV SOLN: 500.0000 mL | INTRAVENOUS | Status: DC

## 2015-04-05 NOTE — Progress Notes (Signed)
Called to room to assist during endoscopic procedure.  Patient ID and intended procedure confirmed with present staff. Received instructions for my participation in the procedure from the performing physician.  

## 2015-04-05 NOTE — Patient Instructions (Signed)
Impressions/recommendations:  Polyps (handout given)  Hold aspirin and NSAID products for 2 weeks. May resume October 5th. Tylenol only until then.  YOU HAD AN ENDOSCOPIC PROCEDURE TODAY AT [facility]:   Refer to the procedure report that was given to you for any specific questions about what was found during the examination.  If the procedure report does not answer your questions, please call your gastroenterologist to clarify.  If you requested that your care partner not be given the details of your procedure findings, then the procedure report has been included in a sealed envelope for you to review at your convenience later.  YOU SHOULD EXPECT: Some feelings of bloating in the abdomen. Passage of more gas than usual.  Walking can help get rid of the air that was put into your GI tract during the procedure and reduce the bloating. If you had a lower endoscopy (such as a colonoscopy or flexible sigmoidoscopy) you may notice spotting of blood in your stool or on the toilet paper. If you underwent a bowel prep for your procedure, you may not have a normal bowel movement for a few days.  Please Note:  You might notice some irritation and congestion in your nose or some drainage.  This is from the oxygen used during your procedure.  There is no need for concern and it should clear up in a day or so.  SYMPTOMS TO REPORT IMMEDIATELY:   Following lower endoscopy (colonoscopy or flexible sigmoidoscopy):  Excessive amounts of blood in the stool  Significant tenderness or worsening of abdominal pains  Swelling of the abdomen that is new, acute  Fever of 100F or higher   For urgent or emergent issues, a gastroenterologist can be reached at any hour by calling [phone].   DIET: Your first meal following the procedure should be a small meal and then it is ok to progress to your normal diet. Heavy or fried foods are harder to digest and may make you feel nauseous or bloated.   Likewise, meals heavy in dairy and vegetables can increase bloating.  Drink plenty of fluids but you should avoid alcoholic beverages for 24 hours.  ACTIVITY:  You should plan to take it easy for the rest of today and you should NOT DRIVE or use heavy machinery until tomorrow (because of the sedation medicines used during the test).    FOLLOW UP: Our staff will call the number listed on your records the next business day following your procedure to check on you and address any questions or concerns that you may have regarding the information given to you following your procedure. If we do not reach you, we will leave a message.  However, if you are feeling well and you are not experiencing any problems, there is no need to return our call.  We will assume that you have returned to your regular daily activities without incident.  If any biopsies were taken you will be contacted by phone or by letter within the next 1-3 weeks.  Please call us at [phone] if you have not heard about the biopsies in 3 weeks.    SIGNATURES/CONFIDENTIALITY: You and/or your care partner have signed paperwork which will be entered into your electronic medical record.  These signatures attest to the fact that that the information above on your After Visit Summary has been reviewed and is understood.  Full responsibility of the confidentiality of this discharge information lies with you and/or your care-partner.

## 2015-04-05 NOTE — Progress Notes (Signed)
Report to PACU, RN, vss, BBS= Clear.  

## 2015-04-05 NOTE — Op Note (Signed)
[place]  [address] [place], [id number]   COLONOSCOPY PROCEDURE REPORT  PATIENT: Tamara West, Tamara West  MR#: [id number] BIRTHDATE: Feb 07, 1956 , 23  yrs. old GENDER: female ENDOSCOPIST: Ladene Artist, MD, [facility] REFERRED [place]:PXTGG [facility], M.D. PROCEDURE DATE:  04/05/2015 PROCEDURE:   Colonoscopy, screening, Colonoscopy with biopsy, and Colonoscopy with snare polypectomy First Screening Colonoscopy - Avg.  risk and is 50 yrs.  old or older Yes.  Prior Negative Screening - Now for repeat screening. N/A  History of Adenoma - Now for follow-up colonoscopy & has been > or = to 3 yrs.  N/A  Polyps removed today? Yes ASA CLASS:   Class II INDICATIONS:Screening for colonic neoplasia and Colorectal Neoplasm Risk Assessment for this procedure is average risk. MEDICATIONS: Monitored anesthesia care and Propofol 270 mg IV DESCRIPTION OF PROCEDURE:   After the risks benefits and alternatives of the procedure were thoroughly explained, informed consent was obtained.  The digital rectal exam revealed no abnormalities of the rectum.   The LB [id number]  endoscope was introduced through the anus and advanced to the cecum, which was identified by both the appendix and ileocecal valve. No adverse events experienced.   The quality of the prep was good.  (MiraLax was used)  The instrument was then slowly withdrawn as the colon was fully examined. Estimated blood loss is zero unless otherwise noted in this procedure report.   COLON FINDINGS: Two polyps measuring 7-8 mm in size were found in the sigmoid colon (77mm semipedunculated) and transverse colon (3mm sessile).  Polypectomies were performed with a cold snare.  The resection was complete, the polyp tissue was completely retrieved and sent to histology.   Two sessile polyps measuring 5 mm in size were found in the descending colon and at the ileocecal valve. Polypectomies were performed with cold forceps.  The resection  was complete, the polyp tissue was completely retrieved and sent to histology.   The examination was otherwise normal.  Retroflexed views revealed no abnormalities. The time to cecum = 2.3 Withdrawal time = 11.9   The scope was withdrawn and the procedure completed. COMPLICATIONS: There were no immediate complications.  ENDOSCOPIC IMPRESSION: 1.   Two polyps in the sigmoid colon and transverse colon; polypectomies performed with a cold snare 2.   Two sessile polyps in the descending colon and at the ileocecal valve; polypectomies performed with cold forceps 3.   The examination was otherwise normal  RECOMMENDATIONS: 1.  Await pathology results 2.  Hold Aspirin and all other NSAIDS for 2 weeks. 3.  Repeat colonoscopy in 3 years if 3-4 polyps adenomatous, 5 years if 1-2 polyps adenomatous; otherwise 10 years  eSigned:  Ladene Artist, MD, [facility] 04/05/2015 10:41 AM

## 2015-04-06 ENCOUNTER — Encounter: Payer: Self-pay | Admitting: Neurology

## 2015-04-06 ENCOUNTER — Telehealth: Payer: Self-pay | Admitting: *Deleted

## 2015-04-06 ENCOUNTER — Ambulatory Visit (INDEPENDENT_AMBULATORY_CARE_PROVIDER_SITE_OTHER): Payer: BC Managed Care – PPO | Admitting: Neurology

## 2015-04-06 VITALS — BP 132/82 | HR 70 | Resp 16 | Ht 62.0 in | Wt 176.0 lb

## 2015-04-06 DIAGNOSIS — R351 Nocturia: Secondary | ICD-10-CM

## 2015-04-06 DIAGNOSIS — R0683 Snoring: Secondary | ICD-10-CM | POA: Diagnosis not present

## 2015-04-06 DIAGNOSIS — E669 Obesity, unspecified: Secondary | ICD-10-CM | POA: Diagnosis not present

## 2015-04-06 NOTE — Progress Notes (Signed)
Subjective:    Patient ID: Tamara West is a 59 y.o. female.  HPI     Star Age, MD, PhD [facility] [address] P.O. [place], [place] [address]  Dear Farris Has,   I saw your patient, Tamara West, upon your kind request in my neurologic clinic today for initial consultation of her sleep disorder, in particular, concern for underlying obstructive sleep apnea. The patient is unaccompanied today. 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 She showed me paperwork which stated that she needed a sleep study by 04/27/2015. I reviewed your office note from 02/03/2015 which he kindly included. She had blood work that day: Vitamin D was borderline at 29.1, TSH 1.46 in the normal range, free T4 1 0.4, free T3 2 0.6, total cholesterol 204, LDL 135, triglycerides 58, CMP normal.  she works as a Teacher, early years/pre for [employer] 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 Her bedtime is around 9 PM and she falls asleep quickly. Her rise time is 5 AM. She feels adequately rested. She is not particularly sleepy during the day. Her Epworth sleepiness score is 1 out of 24, her fatigue score is 14 out of 63. She denies a family history of OSA. She denies restless leg symptoms or morning headaches. She does have nocturia, on average twice per night. She lives alone. She is widowed. She has 4 grown children, 9 grandchildren and 2 great-grandchildren on the way. She quit smoking and drinking alcohol in 1989. She drinks caffeine occasionally.  She does not typically take a nap.  She has lost a little bit overweight.   Her Past Medical History Is Significant For: Past Medical History  Diagnosis Date  . Depression   . Anxiety   . Thyroid disease   . Hypertension   . Hyperlipidemia     Her Past Surgical History Is Significant For: Past Surgical History  Procedure Laterality Date  . Abdominal hysterectomy    . Colonoscopy  2004    w/Dr.Mann=hemorrhoid     Her Family History Is Significant For: Family History  Problem Relation Age of Onset  . Colon cancer Neg Hx   . Stroke Father   . Hypertension Sister   . Hypertension Brother     Her Social History Is Significant For: Social History   Social History  . Marital Status: Widowed    Spouse Name: N/A  . Number of Children: 4  . Years of Education: 11   Occupational History  . Bus Medical illustrator   Social History Main Topics  . Smoking status: Former Research scientist (life sciences)  . Smokeless tobacco: Never Used     Comment: Quit 1989  . Alcohol Use: No  . Drug Use: No  . Sexual Activity: No   Other Topics Concern  . Not on file   Social History Narrative   Occasionally drinks soda    Her Allergies Are:  Allergies  Allergen Reactions  . Penicillins Swelling  . Sulfa Antibiotics Other (See Comments)    knots  :   Her Current Medications Are:  Outpatient Encounter Prescriptions as of  04/06/2015  Medication Sig  . levothyroxine (SYNTHROID, LEVOTHROID) 25 MCG tablet Take 25 mcg by mouth daily before breakfast.  . losartan-hydrochlorothiazide (HYZAAR) 50-12.5 MG per tablet   . simvastatin (ZOCOR) 40 MG tablet Take 40 mg by mouth every evening.  . [DISCONTINUED] docusate sodium (COLACE) 100 MG capsule Take 1 capsule (100 mg total) by mouth 2 (two) times daily.  . [DISCONTINUED] permethrin (PERMETHRIN LICE TREATMENT) 1 % lotion Apply 1 application topically once. Shampoo, rinse and towel dry hair, saturate hair and scalp with permethrin. Rinse after 10 min; repeat in 1 week if needed (Patient not taking:  Reported on 03/22/2015)  . [DISCONTINUED] polyethylene glycol powder (GLYCOLAX/MIRALAX) powder Take 17 g by mouth 3 (three) times daily. Take for the next five days. (Patient not taking: Reported on 03/22/2015)   No facility-administered encounter medications on file as of 04/06/2015.  :  Review of Systems:  Out of a complete 14 point review of systems, all are reviewed and negative with the exception of these symptoms as listed below:  Review of Systems  Musculoskeletal:       Cramps  Neurological:       No trouble falling asleep, has trouble staying asleep, snoring, sometimes wakes up feeling tired, denies taking naps.   Psychiatric/Behavioral:       Not enough sleep     Objective:  Neurologic Exam  Physical Exam Physical Examination:   Filed Vitals:   04/06/15 1505  BP: 132/82  Pulse: 70  Resp: 16   General Examination: The patient is a very pleasant 59 y.o. female in no acute distress. She appears well-developed and well-nourished and well groomed.   HEENT: Normocephalic, atraumatic, pupils are equal, round and reactive to light and accommodation. Funduscopic exam is normal with sharp disc margins noted. Extraocular tracking is good without limitation to gaze excursion or nystagmus noted. Normal smooth pursuit is noted. Hearing is grossly intact. Tympanic membranes are clear bilaterally. Face is symmetric with normal facial animation and normal facial sensation. Speech is clear with no dysarthria noted. There is no hypophonia. There is no lip, neck/head, jaw or voice tremor. Neck is supple with full range of passive and active motion. There are no carotid bruits on auscultation. Oropharynx exam reveals: mild mouth dryness, adequate dental hygiene and moderate airway crowding, due to narrow airway entry and larger tongue. Mallampati is class II. Tongue protrudes centrally and palate elevates symmetrically. Tonsils are 1+ in size. Neck size is 14 1/8 inches. She has a Absent overbite.  Nasal inspection reveals no significant nasal mucosal bogginess or redness and no septal deviation.   Chest: Clear to auscultation without wheezing, rhonchi or crackles noted.  Heart: S1+S2+0, regular and normal without murmurs, rubs or gallops noted.   Abdomen: Soft, non-tender and non-distended with normal bowel sounds appreciated on auscultation.  Extremities: There is no pitting edema in the distal lower extremities bilaterally. Pedal pulses are intact.  Skin: Warm and dry without trophic changes noted. There are no varicose veins.  Musculoskeletal: exam reveals no obvious joint deformities, tenderness or joint swelling or erythema.   Neurologically:  Mental status: The patient is awake, alert and oriented in all 4 spheres. Her immediate and remote memory, attention, language skills and fund of knowledge are appropriate. There is no evidence of aphasia, agnosia, apraxia or anomia. Speech is clear with normal prosody and enunciation. Thought process is linear. Mood is normal and affect is normal.  Cranial nerves II - XII are as described above under HEENT  exam. In addition: shoulder shrug is normal with equal shoulder height noted. Motor exam: Normal bulk, strength and tone is noted. There is no drift, tremor or rebound. Romberg is negative. Reflexes are 2+ throughout. Babinski: Toes are flexor bilaterally. Fine motor skills and coordination: intact with normal finger taps, normal hand movements, normal rapid alternating patting, normal foot taps and normal foot agility.  Cerebellar testing: No dysmetria or intention tremor on finger to nose testing. Heel to shin is unremarkable bilaterally. There is no truncal or gait ataxia.  Sensory exam: intact to light touch, pinprick, vibration, temperature sense in the upper and lower extremities.  Gait, station and balance: She stands easily. No veering to one side is noted. No leaning to one side is noted. Posture is age-appropriate and stance is  narrow based. Gait shows normal stride length and normal pace. No problems turning are noted. She turns en bloc. Tandem walk is unremarkable.   Assessment and Plan:   In summary, Tamara West is a very pleasant 59 y.o.-year old female with an underlying medical history of depression, anxiety, vitamin D deficiency, hypertension, hyperlipidemia, and obesity, who reports snoring and sleep disruption, she reports nocturia. 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  I had a long chat with the patient about my findings and the diagnosis of OSA, its prognosis and treatment options. We talked about medical treatments, surgical interventions and non-pharmacological approaches. I explained in particular the risks and ramifications of untreated moderate to severe OSA, especially with respect to developing cardiovascular disease down the Road, including congestive heart failure, difficult to treat hypertension, cardiac arrhythmias, or stroke. Even type 2 diabetes has, in part, been linked to untreated OSA. Symptoms of untreated OSA include daytime sleepiness, memory problems, mood irritability and mood disorder such as depression and anxiety, lack of energy, as well as recurrent headaches, especially morning headaches. We talked about trying to maintain a healthy lifestyle in general, as well as the importance of weight control. I encouraged the patient to eat healthy, exercise daily and keep well hydrated, to keep a scheduled bedtime and wake time routine, to not skip any meals and eat healthy snacks in between meals. I advised the patient not to drive when feeling sleepy. I recommended the following at this time: sleep study with potential positive airway pressure titration. (We will score hypopneas at 3% and split the sleep study into diagnostic and treatment portion, if the estimated. 2 hour AHI is >15/h).   I explained the sleep  test procedure to the patient and also outlined possible surgical and non-surgical treatment options of OSA, including the use of a custom-made dental device (which would require a referral to a specialist dentist or oral surgeon), upper airway surgical options, such as pillar implants, radiofrequency surgery, tongue base surgery, and UPPP (which would involve a referral to an ENT surgeon). Rarely, jaw surgery such as mandibular advancement may be considered.  I also explained the CPAP treatment option to the patient, who indicated that she would be willing to try CPAP if the need arises. I explained the importance of being compliant with PAP treatment, not only for insurance purposes but primarily to improve Her symptoms, and for the patient's long term health benefit, including to reduce Her cardiovascular risks. I answered all her questions today and the patient was in agreement. I would like to see her back after the sleep study is completed and encouraged her  to call with any interim questions, concerns, problems or updates.   Thank you very much for allowing me to participate in the care of this nice patient. If I can be of any further assistance to you please do not hesitate to call me at [phone].  Sincerely,   Star Age, MD, PhD

## 2015-04-06 NOTE — Telephone Encounter (Signed)
  Follow up Call-  Call back number 04/05/2015  Post procedure Call Back phone  # [phone]  Permission to leave phone message Yes     Patient questions:  Do you have a fever, pain , or abdominal swelling? No. Pain Score  0 *  Have you tolerated food without any problems? Yes.    Have you been able to return to your normal activities? Yes.    Do you have any questions about your discharge instructions: Diet   No. Medications  No. Follow up visit  No.  Do you have questions or concerns about your Care? No.  Actions: * If pain score is 4 or above: No action needed, pain <4.

## 2015-04-06 NOTE — Patient Instructions (Signed)
Based on your symptoms and your exam I believe you may be at risk for obstructive sleep apnea or OSA, and I think we should proceed with a sleep study to determine whether you do or do not have OSA and how severe it is. If you have more than mild OSA, I want you to consider treatment with CPAP. Please remember, the risks and ramifications of moderate to severe obstructive sleep apnea or OSA are: Cardiovascular disease, including congestive heart failure, stroke, difficult to control hypertension, arrhythmias, and even type 2 diabetes has been linked to untreated OSA. Sleep apnea causes disruption of sleep and sleep deprivation in most cases, which, in turn, can cause recurrent headaches, problems with memory, mood, concentration, focus, and vigilance. Most people with untreated sleep apnea report excessive daytime sleepiness, which can affect their ability to drive. Please do not drive if you feel sleepy.   I will likely see you back after your sleep study to go over the test results and where to go from there. We will call you after your sleep study to advise about the results (most likely, you will hear from Diana, my nurse) and to set up an appointment at the time, as necessary.    Our sleep lab administrative assistant, Dawn will meet with you or call you to schedule your sleep study. If you don't hear back from her by next week please feel free to call her at [phone]. This is her direct line and please leave a message with your phone number to call back if you get the voicemail box. She will call back as soon as possible.   

## 2015-04-10 ENCOUNTER — Encounter: Payer: Self-pay | Admitting: Gastroenterology

## 2015-04-16 ENCOUNTER — Ambulatory Visit (INDEPENDENT_AMBULATORY_CARE_PROVIDER_SITE_OTHER): Payer: BC Managed Care – PPO | Admitting: Neurology

## 2015-04-16 DIAGNOSIS — G4733 Obstructive sleep apnea (adult) (pediatric): Secondary | ICD-10-CM | POA: Diagnosis not present

## 2015-04-16 DIAGNOSIS — G4720 Circadian rhythm sleep disorder, unspecified type: Secondary | ICD-10-CM

## 2015-04-16 DIAGNOSIS — G479 Sleep disorder, unspecified: Secondary | ICD-10-CM

## 2015-04-16 DIAGNOSIS — G4761 Periodic limb movement disorder: Secondary | ICD-10-CM

## 2015-04-17 ENCOUNTER — Institutional Professional Consult (permissible substitution): Payer: BC Managed Care – PPO | Admitting: Neurology

## 2015-04-17 NOTE — Sleep Study (Signed)
Please see the scanned sleep study interpretation located in the Procedure tab within the Chart Review section. 

## 2015-04-18 ENCOUNTER — Telehealth: Payer: Self-pay | Admitting: Neurology

## 2015-04-18 NOTE — Telephone Encounter (Signed)
Patient is calling and wants the results of her sleep test.  She also stated she has a paper that needs to be filled out for [employer]. Please call.  Thanks!

## 2015-04-19 NOTE — Telephone Encounter (Signed)
Study done 10/2. Dr. Rexene Alberts should read it today or tomorrow.

## 2015-04-19 NOTE — Telephone Encounter (Signed)
I spoke to [place] and informed her that I will call as soon as I get results.

## 2015-04-20 NOTE — Telephone Encounter (Signed)
Patient is anxious to get results.

## 2015-04-21 ENCOUNTER — Telehealth: Payer: Self-pay | Admitting: Neurology

## 2015-04-21 NOTE — Telephone Encounter (Signed)
Patient referred by PCP, Ms. Lavina Hamman, seen by me on 04/06/15, diagnostic PSG on 04/16/15, ins: [employer].    Please call and notify the patient that the recent sleep study showed overall mild or borderline obstructive sleep apnea. 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 To that end I can order autoPAP, which means, that we don't have to bring her back for a second sleep study with CPAP, but will let her try an autoPAP machine at home, through a DME company (of her choice, or as per insurance requirement). The DME representative will educate her on how to use the machine, how to put the mask on, etc. I have note yet placed an order in the chart. Please let me know. In addition, weight loss is recommended. Please also make a FU appointment, either way, and send report to PCP.   Star Age, MD, PhD [facility])

## 2015-04-24 NOTE — Telephone Encounter (Signed)
Pt called and would like to know her sleep study results. 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 Please call and advise [phone]

## 2015-04-24 NOTE — Telephone Encounter (Signed)
I spoke to patient. She needs to turn in information before 04/27/15 in order to renew licence. Per patient request I will put copy of study at front desk for patient to pick up and submit to her work. She will find out if we need to fill out a form for her. She would like to hold off now on doing Auto-pap. States that she will let us know if she wants to start.

## 2015-05-05 ENCOUNTER — Telehealth: Payer: Self-pay | Admitting: Neurology

## 2015-05-05 DIAGNOSIS — G4733 Obstructive sleep apnea (adult) (pediatric): Secondary | ICD-10-CM

## 2015-05-05 NOTE — Telephone Encounter (Signed)
Please get patient started on AutoPAP. Order placed in chart. Will need FU with me in 8-10 weeks as well.  I called patient and told her we will send off order to a DME company and Beverlee Nims will call for FU appt and details.

## 2015-05-05 NOTE — Telephone Encounter (Signed)
Pt called and would like to get a CPAP. Please call and advise [phone]

## 2015-05-08 NOTE — Telephone Encounter (Signed)
Patient also asked if she could get a letter to dismiss her from jury duty. I advised her that sleep apnea would not be a reasonable cause for not serving jury duty.

## 2015-05-08 NOTE — Telephone Encounter (Signed)
I spoke to patient and she is willing to start treatment. We were able to set f/u appt. I will refer patient to DME company. Study has been sent to PCP.

## 2015-05-15 ENCOUNTER — Telehealth: Payer: Self-pay

## 2015-05-15 NOTE — Telephone Encounter (Signed)
[facility] emailed me to advise that patient has turned down CPAP order due to cost. I was advised that patient will call them back when she gets money to help pay for the machine.

## 2015-07-20 ENCOUNTER — Ambulatory Visit: Payer: Self-pay | Admitting: Neurology

## 2016-01-19 ENCOUNTER — Other Ambulatory Visit: Payer: Self-pay | Admitting: Internal Medicine

## 2016-01-19 DIAGNOSIS — Z1231 Encounter for screening mammogram for malignant neoplasm of breast: Secondary | ICD-10-CM

## 2016-01-31 ENCOUNTER — Ambulatory Visit
Admission: RE | Admit: 2016-01-31 | Discharge: 2016-01-31 | Disposition: A | Discharge status: Home / Self Care | Payer: BC Managed Care – PPO | Source: Ambulatory Visit | Attending: Internal Medicine | Admitting: Internal Medicine

## 2016-01-31 DIAGNOSIS — Z1231 Encounter for screening mammogram for malignant neoplasm of breast: Secondary | ICD-10-CM

## 2016-12-23 ENCOUNTER — Other Ambulatory Visit: Payer: Self-pay | Admitting: Internal Medicine

## 2016-12-23 DIAGNOSIS — Z1231 Encounter for screening mammogram for malignant neoplasm of breast: Secondary | ICD-10-CM

## 2017-02-03 ENCOUNTER — Ambulatory Visit
Admission: RE | Admit: 2017-02-03 | Discharge: 2017-02-03 | Disposition: A | Discharge status: Home / Self Care | Payer: BC Managed Care – PPO | Source: Ambulatory Visit | Attending: Internal Medicine | Admitting: Internal Medicine

## 2017-02-03 DIAGNOSIS — Z1231 Encounter for screening mammogram for malignant neoplasm of breast: Secondary | ICD-10-CM

## 2017-12-26 ENCOUNTER — Other Ambulatory Visit: Payer: Self-pay | Admitting: Internal Medicine

## 2017-12-26 DIAGNOSIS — Z1231 Encounter for screening mammogram for malignant neoplasm of breast: Secondary | ICD-10-CM

## 2018-02-05 ENCOUNTER — Ambulatory Visit: Payer: BC Managed Care – PPO

## 2018-02-25 ENCOUNTER — Ambulatory Visit
Admission: RE | Admit: 2018-02-25 | Discharge: 2018-02-25 | Disposition: A | Discharge status: Home / Self Care | Payer: BC Managed Care – PPO | Source: Ambulatory Visit | Attending: Internal Medicine | Admitting: Internal Medicine

## 2018-02-25 DIAGNOSIS — Z1231 Encounter for screening mammogram for malignant neoplasm of breast: Secondary | ICD-10-CM

## 2018-04-28 ENCOUNTER — Other Ambulatory Visit: Payer: Self-pay | Admitting: Nurse Practitioner

## 2018-05-20 ENCOUNTER — Other Ambulatory Visit: Payer: Self-pay | Admitting: Nurse Practitioner

## 2018-05-20 DIAGNOSIS — E039 Hypothyroidism, unspecified: Secondary | ICD-10-CM

## 2018-05-20 MED ORDER — LEVOTHYROXINE SODIUM 25 MCG PO TABS: 25.0000 ug | ORAL_TABLET | Freq: Every day | ORAL | 1 refills | Status: DC

## 2018-06-24 ENCOUNTER — Other Ambulatory Visit: Payer: Self-pay | Admitting: Nurse Practitioner

## 2018-08-06 ENCOUNTER — Encounter: Payer: Self-pay | Admitting: Nurse Practitioner

## 2018-08-06 ENCOUNTER — Ambulatory Visit: Payer: BC Managed Care – PPO | Admitting: Nurse Practitioner

## 2018-08-06 VITALS — BP 142/92 | HR 72 | Temp 97.9°F | Ht 59.0 in | Wt 149.6 lb

## 2018-08-06 DIAGNOSIS — I10 Essential (primary) hypertension: Secondary | ICD-10-CM | POA: Diagnosis not present

## 2018-08-06 DIAGNOSIS — Z113 Encounter for screening for infections with a predominantly sexual mode of transmission: Secondary | ICD-10-CM | POA: Diagnosis not present

## 2018-08-06 DIAGNOSIS — Z0000 Encounter for general adult medical examination without abnormal findings: Secondary | ICD-10-CM | POA: Diagnosis not present

## 2018-08-06 DIAGNOSIS — E039 Hypothyroidism, unspecified: Secondary | ICD-10-CM

## 2018-08-06 DIAGNOSIS — Z1159 Encounter for screening for other viral diseases: Secondary | ICD-10-CM

## 2018-08-06 DIAGNOSIS — J3089 Other allergic rhinitis: Secondary | ICD-10-CM

## 2018-08-06 LAB — POCT URINALYSIS DIPSTICK
Bilirubin, UA: NEGATIVE
Blood, UA: NEGATIVE
GLUCOSE UA: NEGATIVE
KETONES UA: NEGATIVE
LEUKOCYTES UA: NEGATIVE
NITRITE UA: NEGATIVE
PH UA: 5.5 (ref 5.0–8.0)
Protein, UA: NEGATIVE
SPEC GRAV UA: 1.025 (ref 1.010–1.025)
UROBILINOGEN UA: 0.2 U/dL

## 2018-08-06 LAB — POCT UA - MICROALBUMIN
Albumin/Creatinine Ratio, Urine, POC: <30
CREATININE, POC: 300 mg/dL
Microalbumin Ur, POC: 30 mg/L

## 2018-08-06 MED ORDER — MOMETASONE FUROATE 50 MCG/ACT NA SUSP: 2.0000 | Freq: Every day | NASAL | 2 refills | Status: DC

## 2018-08-06 NOTE — Patient Instructions (Signed)

## 2018-08-06 NOTE — Progress Notes (Signed)
Subjective:     Patient ID: Tamara West , female    DOB: Mar 09, 1956 , 63 y.o.   MRN: [id number]   Chief Complaint  Patient presents with  . Annual Exam   The patient states she uses status post hysterectomy for birth control. Last LMP was No LMP recorded. Patient has had a hysterectomy.. Negative for Dysmenorrhea and Negative for Menorrhagia Mammogram last done 02/25/2018 Negative for: breast discharge, breast lump(s), breast pain and breast self exam.  Pertinent negatives include abnormal bleeding (hematology), anxiety, decreased libido, depression, difficulty falling sleep, dyspareunia, history of infertility, nocturia, sexual dysfunction, sleep disturbances, urinary incontinence, urinary urgency, vaginal discharge and vaginal itching. Diet generally healthy. She is not eating as much pork, and junk food.  The patient states her exercise level is   works in the yard for her exercise.  "I am always walking"   The patient's tobacco use is:  Social History   Tobacco Use  Smoking Status Former Smoker  Smokeless Tobacco Never Used  Tobacco Comment   Quit 1989  . She has been exposed to passive smoke. The patient's alcohol use is:  Social History   Substance and Sexual Activity  Alcohol Use No  . Alcohol/week: 0.0 standard drinks  . Additional information: Last pap hysterectomy.   HPI  Hyperlipemia - she is taking atorvastatin without any difficulty  Hypertension  This is a chronic problem. The current episode started more than 1 year ago. Progression since onset: slightly elevated today reports was 140/80. she has not taken her blood pressure medications yet. Pertinent negatives include no chest pain, headaches or palpitations. There are no associated agents to hypertension. Risk factors for coronary artery disease include sedentary lifestyle. Past treatments include angiotensin blockers. There are no compliance problems.  Identifiable causes of hypertension include a thyroid problem.  There is no history of chronic renal disease.  Thyroid Problem  Presents for follow-up visit. Patient reports no anxiety, fatigue, palpitations or weight gain. The symptoms have been stable.     Past Medical History:  Diagnosis Date  . Anxiety   . Depression   . Hyperlipidemia   . Hypertension   . Thyroid disease      Family History  Problem Relation Age of Onset  . Stroke Father   . Hypertension Sister   . Hypertension Brother   . Colon cancer Neg Hx      Current Outpatient Medications:  .  levothyroxine (SYNTHROID, LEVOTHROID) 25 MCG tablet, Take 1 tablet (25 mcg total) by mouth daily before breakfast., Disp: 90 tablet, Rfl: 1 .  olmesartan-hydrochlorothiazide (BENICAR HCT) 20-12.5 MG tablet, Take 1 tablet by mouth daily., Disp: , Rfl:  .  simvastatin (ZOCOR) 20 MG tablet, TAKE 1 TABLET BY MOUTH EVERYDAY AT BEDTIME, Disp: 30 tablet, Rfl: 1   Allergies  Allergen Reactions  . Penicillins Swelling  . Sulfa Antibiotics Other (See Comments)    knots     Review of Systems  Constitutional: Negative.  Negative for fatigue and weight gain.  HENT: Negative.   Eyes: Negative.   Respiratory: Negative.   Cardiovascular: Negative.  Negative for chest pain, palpitations and leg swelling.  Gastrointestinal: Negative.   Endocrine: Negative.   Genitourinary: Negative.   Musculoskeletal: Negative.   Skin: Negative.   Allergic/Immunologic: Negative.   Neurological: Negative.  Negative for dizziness and headaches.  Hematological: Negative.   Psychiatric/Behavioral: Negative.  The patient is not nervous/anxious.      Today's Vitals   08/06/18 0924  BP: Marland Kitchen)  142/92  Pulse: 72  Temp: 97.9 F (36.6 C)  TempSrc: Oral  Weight: 149 lb 9.6 oz (67.9 kg)  Height: 4\' 11"  (1.499 m)  PainSc: 0-No pain   Body mass index is 30.22 kg/m.   Objective:  Physical Exam Vitals signs reviewed.  Constitutional:      Appearance: Normal appearance. She is well-developed. She is obese.  HENT:      Head: Normocephalic and atraumatic.     Right Ear: Hearing, tympanic membrane, ear canal and external ear normal.     Left Ear: Hearing, tympanic membrane, ear canal and external ear normal.     Nose: Nose normal.     Right Turbinates: Enlarged (mild).     Left Turbinates: Enlarged (mild).     Right Sinus: No maxillary sinus tenderness or frontal sinus tenderness.     Left Sinus: No maxillary sinus tenderness or frontal sinus tenderness.  Eyes:     General: Lids are normal.     Conjunctiva/sclera: Conjunctivae normal.     Pupils: Pupils are equal, round, and reactive to light.     Funduscopic exam:    Right eye: No papilledema.        Left eye: No papilledema.  Neck:     Musculoskeletal: Full passive range of motion without pain, normal range of motion and neck supple.     Thyroid: No thyroid mass.     Vascular: No carotid bruit.  Cardiovascular:     Rate and Rhythm: Normal rate and regular rhythm.     Pulses: Normal pulses.     Heart sounds: Normal heart sounds. No murmur.  Pulmonary:     Effort: Pulmonary effort is normal. No respiratory distress.     Breath sounds: Normal breath sounds.  Abdominal:     General: Bowel sounds are normal.     Palpations: Abdomen is soft.  Musculoskeletal: Normal range of motion.  Skin:    General: Skin is warm and dry.     Capillary Refill: Capillary refill takes less than 2 seconds.  Neurological:     Mental Status: She is alert and oriented to person, place, and time.     Cranial Nerves: No cranial nerve deficit.     Sensory: No sensory deficit.  Psychiatric:        Behavior: Behavior normal.        Thought Content: Thought content normal.        Judgment: Judgment normal.         Assessment And Plan:     1. Health maintenance examination  Pt's annual wellness exam was performed and geriatric assessment reviewed.   Pt has no new identiafble wellness concerns at this time.   WIll obtain routine labs.   Will obtain UA and  micro.   Behavior modifications discussed and diet history reviewed. Pt will continue to exercise regularly and modify diet, with low GI, plant based foods and decrease food intake of processed foods.   Recommend intake of daily multivitamin, Vitamin D, and calcium.  Recommond mammogram and colonoscopy for preventive screenings, as well as recommend immunizations that include influenza (up to date) and TDAP - Lipid Profile  2. Encounter for screening examination for sexually transmitted disease  - Hepatitis C antibody  3. Encounter for hepatitis C screening test for low risk patient  - Hepatitis C antibody  4. Essential (primary) hypertension . B/P is elevated today, she has not taken her blood pressure medications at this time.  Fair control.  Marland Kitchen  CMP ordered to check renal function.  . The importance of regular exercise and dietary modification was stressed to the patient.  . Stressed importance of losing ten percent of her body weight to help with B/P control.  . The weight loss would help with decreasing cardiac and cancer risk as well.  - EKG 12-Lead - POCT Urinalysis Dipstick (81002) - POCT UA - Microalbumin - CMP14 + Anion Gap - CBC no Diff - Hemoglobin A1c - Lipid Profile  5. Non-seasonal allergic rhinitis, unspecified trigger  Mild enlarged turbinates  Will treat with nasonex - mometasone (NASONEX) 50 MCG/ACT nasal spray; Place 2 sprays into the nose daily.  Dispense: 17 g; Refill: 2  6. Hypothyroidism, unspecified type  Chronic, controlled  Continue with current medications - TSH - T3 - T4, Free  Minette Brine, FNP

## 2018-08-07 LAB — LIPID PANEL
Chol/HDL Ratio: 3.7 ratio (ref 0.0–4.4)
Cholesterol, Total: 226 mg/dL — ABNORMAL HIGH (ref 100–199)
HDL: 61 mg/dL (ref >39)
LDL Calculated: 148 mg/dL — ABNORMAL HIGH (ref 0–99)
Triglycerides: 87 mg/dL (ref 0–149)
VLDL CHOLESTEROL CAL: 17 mg/dL (ref 5–40)

## 2018-08-07 LAB — CBC
HEMATOCRIT: 38.2 % (ref 34.0–46.6)
Hemoglobin: 13 g/dL (ref 11.1–15.9)
MCH: 29.3 pg (ref 26.6–33.0)
MCHC: 34 g/dL (ref 31.5–35.7)
MCV: 86 fL (ref 79–97)
Platelets: 267 10*3/uL (ref 150–450)
RBC: 4.44 x10E6/uL (ref 3.77–5.28)
RDW: 12.3 % (ref 11.7–15.4)
WBC: 6.9 10*3/uL (ref 3.4–10.8)

## 2018-08-07 LAB — CMP14 + ANION GAP
ALT: 17 IU/L (ref 0–32)
AST: 22 IU/L (ref 0–40)
Albumin/Globulin Ratio: 1.6 (ref 1.2–2.2)
Albumin: 5 g/dL — ABNORMAL HIGH (ref 3.8–4.8)
Alkaline Phosphatase: 95 IU/L (ref 39–117)
Anion Gap: 20 mmol/L — ABNORMAL HIGH (ref 10.0–18.0)
BUN/Creatinine Ratio: 20 (ref 12–28)
BUN: 18 mg/dL (ref 8–27)
Bilirubin Total: 0.2 mg/dL (ref 0.0–1.2)
CALCIUM: 10.5 mg/dL — AB (ref 8.7–10.3)
CO2: 23 mmol/L (ref 20–29)
Chloride: 98 mmol/L (ref 96–106)
Creatinine, Ser: 0.9 mg/dL (ref 0.57–1.00)
GFR, EST AFRICAN AMERICAN: 79 mL/min/{1.73_m2} (ref >59)
GFR, EST NON AFRICAN AMERICAN: 68 mL/min/{1.73_m2} (ref >59)
Globulin, Total: 3.2 g/dL (ref 1.5–4.5)
Glucose: 80 mg/dL (ref 65–99)
POTASSIUM: 4.3 mmol/L (ref 3.5–5.2)
Sodium: 141 mmol/L (ref 134–144)
Total Protein: 8.2 g/dL (ref 6.0–8.5)

## 2018-08-07 LAB — HEMOGLOBIN A1C
Est. average glucose Bld gHb Est-mCnc: 111 mg/dL
Hgb A1c MFr Bld: 5.5 % (ref 4.8–5.6)

## 2018-08-07 LAB — TSH: TSH: 3.62 u[IU]/mL (ref 0.450–4.500)

## 2018-08-07 LAB — HEPATITIS C ANTIBODY: Hep C Virus Ab: <0.1 s/co ratio (ref 0.0–0.9)

## 2018-08-07 LAB — T3: T3, Total: 83 ng/dL (ref 71–180)

## 2018-08-07 LAB — T4, FREE: Free T4: 1.21 ng/dL (ref 0.82–1.77)

## 2018-08-28 ENCOUNTER — Other Ambulatory Visit: Payer: Self-pay | Admitting: Nurse Practitioner

## 2018-10-03 ENCOUNTER — Other Ambulatory Visit: Payer: Self-pay | Admitting: Nurse Practitioner

## 2018-10-03 DIAGNOSIS — E039 Hypothyroidism, unspecified: Secondary | ICD-10-CM

## 2018-10-25 ENCOUNTER — Other Ambulatory Visit: Payer: Self-pay | Admitting: Nurse Practitioner

## 2018-11-17 ENCOUNTER — Telehealth: Payer: Self-pay | Admitting: Nurse Practitioner

## 2018-11-17 ENCOUNTER — Telehealth: Payer: Self-pay

## 2018-11-17 ENCOUNTER — Other Ambulatory Visit: Payer: Self-pay

## 2018-11-17 ENCOUNTER — Encounter: Payer: Self-pay | Admitting: Nurse Practitioner

## 2018-11-17 ENCOUNTER — Ambulatory Visit: Payer: BC Managed Care – PPO | Admitting: Nurse Practitioner

## 2018-11-17 DIAGNOSIS — F419 Anxiety disorder, unspecified: Secondary | ICD-10-CM | POA: Diagnosis not present

## 2018-11-17 DIAGNOSIS — R05 Cough: Secondary | ICD-10-CM

## 2018-11-17 DIAGNOSIS — R0602 Shortness of breath: Secondary | ICD-10-CM

## 2018-11-17 MED ORDER — AMITRIPTYLINE HCL 10 MG PO TABS: 10.0000 mg | ORAL_TABLET | Freq: Every day | ORAL | 2 refills | Status: DC

## 2018-11-17 NOTE — Telephone Encounter (Signed)
Returned patient called about SOB but patient when to practice before she could get the call

## 2018-11-17 NOTE — Telephone Encounter (Signed)
Called pharmacy to ask about omesartan.

## 2018-11-17 NOTE — Progress Notes (Signed)
Virtual Visit via Video ([email or web address])   This visit type was conducted due to national recommendations for restrictions regarding the COVID-19 Pandemic (e.g. social distancing) in an effort to limit this patient's exposure and mitigate transmission in our community.  Patients identity confirmed using two different identifiers.  This format is felt to be most appropriate for this patient at this time.  All issues noted in this document were discussed and addressed.  No physical exam was performed (except for noted visual exam findings with Video Visits).    Date:  11/17/2018   ID:  Tamara West, DOB 10-04-1955, MRN [id number]  Patient Location:  Home - spoke with Tamara West  Provider location:   Office    Chief Complaint:  Shortness of breath  History of Present Illness:    Tamara West is a 63 y.o. female who presents via video conferencing for a telehealth visit today.    The patient does have symptoms concerning for COVID-19 infection (fever, chills, cough, or new shortness of breath).   Vicks vapor rub in her nose for the last month due possible sinus infection.  Questionable allergies.    She has been taking olmesartan medoxinil.    Shortness of Breath  This is a new problem. The current episode started in the past 7 days. Pertinent negatives include no chest pain or fever. There is no history of asthma or COPD.  Anxiety  Presents for follow-up visit. Symptoms include shortness of breath. Patient reports no chest pain.   There is no history of asthma.  Cough  This is a new problem. The current episode started yesterday. Associated symptoms include shortness of breath. Pertinent negatives include no chest pain or fever. The treatment provided no relief. There is no history of asthma or COPD.     Past Medical History:  Diagnosis Date  . Anxiety   . Depression   . Hyperlipidemia   . Hypertension   . Thyroid disease    Past Surgical History:  Procedure Laterality Date   . ABDOMINAL HYSTERECTOMY    . COLONOSCOPY  2004   w/Dr.Mann=hemorrhoid      Current Meds  Medication Sig  . levothyroxine (SYNTHROID, LEVOTHROID) 25 MCG tablet TAKE 1 TABLET (25 MCG TOTAL) BY MOUTH DAILY BEFORE BREAKFAST.  . mometasone (NASONEX) 50 MCG/ACT nasal spray Place 2 sprays into the nose daily.  Marland Kitchen olmesartan-hydrochlorothiazide (BENICAR HCT) 20-12.5 MG tablet TAKE 1 TABLET BY MOUTH EVERY DAY  . simvastatin (ZOCOR) 20 MG tablet TAKE 1 TABLET BY MOUTH EVERYDAY AT BEDTIME     Allergies:   Penicillins and Sulfa antibiotics   Social History   Tobacco Use  . Smoking status: Former Research scientist (life sciences)  . Smokeless tobacco: Never Used  . Tobacco comment: Quit 1989  Substance Use Topics  . Alcohol use: No    Alcohol/week: 0.0 standard drinks  . Drug use: No     Family Hx: The patient's family history includes Hypertension in her brother and sister; Stroke in her father. There is no history of Colon cancer.  ROS:   Please see the history of present illness.    Review of Systems  Constitutional: Negative for fever.  Respiratory: Positive for cough and shortness of breath.   Cardiovascular: Negative for chest pain.  Neurological: Negative.   Psychiatric/Behavioral: Negative.     All other systems reviewed and are negative.   Labs/Other Tests and Data Reviewed:    Recent Labs: 08/06/2018: ALT 17; BUN 18; Creatinine, Ser 0.90; Hemoglobin 13.0;  Platelets 267; Potassium 4.3; Sodium 141; TSH 3.620   Recent Lipid Panel Lab Results  Component Value Date/Time   CHOL 226 (H) 08/06/2018 10:47 AM   TRIG 87 08/06/2018 10:47 AM   HDL 61 08/06/2018 10:47 AM   CHOLHDL 3.7 08/06/2018 10:47 AM   LDLCALC 148 (H) 08/06/2018 10:47 AM    Wt Readings from Last 3 Encounters:  08/06/18 149 lb 9.6 oz (67.9 kg)  04/06/15 176 lb (79.8 kg)  04/05/15 185 lb (83.9 kg)     Exam:    Vital Signs:  There were no vitals taken for this visit.    Physical Exam  Constitutional: She is oriented to  person, place, and time and well-developed, well-nourished, and in no distress.  Pulmonary/Chest: Effort normal.  Neurological: She is alert and oriented to person, place, and time.  Psychiatric: Mood, memory, affect and judgment normal.    ASSESSMENT & PLAN:    1. Shortness of breath  I have advised her to go to the ER for evaluation especially particularly due to [place]  She does not look like she is in distress but it is hard to tell since I can not listen to her - Ambulatory referral to Sleep Studies  2. Anxiety  Will refill her amitriptyline this could be causing her anxiety which could also be causing the shortness of breath - amitriptyline (ELAVIL) 10 MG tablet; Take 1 tablet (10 mg total) by mouth at bedtime.  Dispense: 30 tablet; Refill: 2  COVID-19 Education: The signs and symptoms of COVID-19 were discussed with the patient and how to seek care for testing (follow up with PCP or arrange E-visit).  The importance of social distancing was discussed today.  Patient Risk:   After full review of this patients clinical status, I feel that they are at least moderate risk at this time.  Time:   Today, I have spent 14 minutes/ seconds with the patient with telehealth technology discussing above diagnoses.     Medication Adjustments/Labs and Tests Ordered: Current medicines are reviewed at length with the patient today.  Concerns regarding medicines are outlined above.   Tests Ordered: No orders of the defined types were placed in this encounter.   Medication Changes: No orders of the defined types were placed in this encounter.   Disposition:  Follow up prn  Signed, Minette Brine, FNP

## 2018-11-18 ENCOUNTER — Encounter (HOSPITAL_COMMUNITY): Payer: Self-pay

## 2018-11-18 ENCOUNTER — Ambulatory Visit (HOSPITAL_COMMUNITY)
Admission: EM | Admit: 2018-11-18 | Discharge: 2018-11-18 | Disposition: A | Discharge status: Home / Self Care | Payer: BC Managed Care – PPO | Attending: Emergency Medicine | Admitting: Emergency Medicine

## 2018-11-18 ENCOUNTER — Other Ambulatory Visit: Payer: Self-pay

## 2018-11-18 ENCOUNTER — Ambulatory Visit (INDEPENDENT_AMBULATORY_CARE_PROVIDER_SITE_OTHER): Payer: BC Managed Care – PPO

## 2018-11-18 DIAGNOSIS — R0789 Other chest pain: Secondary | ICD-10-CM

## 2018-11-18 DIAGNOSIS — R0602 Shortness of breath: Secondary | ICD-10-CM

## 2018-11-18 DIAGNOSIS — J302 Other seasonal allergic rhinitis: Secondary | ICD-10-CM

## 2018-11-18 DIAGNOSIS — F419 Anxiety disorder, unspecified: Secondary | ICD-10-CM | POA: Diagnosis not present

## 2018-11-18 MED ORDER — ALBUTEROL SULFATE HFA 108 (90 BASE) MCG/ACT IN AERS: 1.0000 | INHALATION_SPRAY | Freq: Four times a day (QID) | RESPIRATORY_TRACT | 0 refills | Status: DC | PRN

## 2018-11-18 MED ORDER — CETIRIZINE HCL 10 MG PO TABS: 10.0000 mg | ORAL_TABLET | Freq: Every day | ORAL | 0 refills | Status: DC

## 2018-11-18 NOTE — ED Notes (Signed)
Patient able to ambulate independently  

## 2018-11-18 NOTE — ED Provider Notes (Signed)
Tamara West    CSN: [id number] Arrival date & time: 11/18/18  4696     History   Chief Complaint Chief Complaint  Patient presents with  . Shortness of Breath    HPI Tamara West is a 63 y.o. female.   Tamara West presents with complaints of intermittent chest tightness over the past few days. Denies  Any chest tightness, palpitations, shortness of breath  Or chest pain currently. The tightness is worse when she starts to feel worried. States she feels stressors currently related to situations in her life including an issue with a neighbor. She has a history of anxiety and panic attacks. States they felt similar with chest tightness. Was started on amitriptyline yesterday with her PCP, states she has had to be on it before in the past as well. States she also feels she has some allergy symptoms with nasal drainage and itching throat. Hasn't been taking any medications for this. History of asthma years ago, no use of inhaler. No leg pain or swelling. No pain with inspiration. No cough. No arm or jaw pain. Last night felt mild shoulder pain which improved with heat application. Occasional diaphoresis which she states is not associated with the chest tightness. Eating doesn't worsen her chest tightness. She hasn't taken her BP medications today. Hx of anxiety, depression, htn, hypothyroidism.    ROS per HPI, negative if not otherwise mentioned.      Past Medical History:  Diagnosis Date  . Anxiety   . Depression   . Hyperlipidemia   . Hypertension   . Thyroid disease     Patient Active Problem List   Diagnosis Date Noted  . Essential (primary) hypertension 08/06/2018  . Non-seasonal allergic rhinitis 08/06/2018  . Hypothyroidism 08/06/2018    Past Surgical History:  Procedure Laterality Date  . ABDOMINAL HYSTERECTOMY    . COLONOSCOPY  2004   w/Dr.Mann=hemorrhoid     OB History   No obstetric history on file.      Home Medications    Prior to  Admission medications   Medication Sig Start Date End Date Taking? Authorizing Provider  levothyroxine (SYNTHROID, LEVOTHROID) 25 MCG tablet TAKE 1 TABLET (25 MCG TOTAL) BY MOUTH DAILY BEFORE BREAKFAST. 10/05/18  Yes Minette Brine, FNP  olmesartan-hydrochlorothiazide (BENICAR HCT) 20-12.5 MG tablet TAKE 1 TABLET BY MOUTH EVERY DAY 10/26/18  Yes Glendale Chard, MD  simvastatin (ZOCOR) 20 MG tablet TAKE 1 TABLET BY MOUTH EVERYDAY AT BEDTIME 08/28/18  Yes Minette Brine, FNP  albuterol (PROAIR HFA) 108 (90 Base) MCG/ACT inhaler Inhale 1-2 puffs into the lungs every 6 (six) hours as needed for wheezing or shortness of breath. 11/18/18   Zigmund Gottron, NP  amitriptyline (ELAVIL) 10 MG tablet Take 1 tablet (10 mg total) by mouth at bedtime. 11/17/18   Minette Brine, FNP  cetirizine (ZYRTEC) 10 MG tablet Take 1 tablet (10 mg total) by mouth daily. 11/18/18   Zigmund Gottron, NP  mometasone (NASONEX) 50 MCG/ACT nasal spray Place 2 sprays into the nose daily. 08/06/18 08/06/19  Minette Brine, FNP    Family History Family History  Problem Relation Age of Onset  . Stroke Father   . Hypertension Sister   . Hypertension Brother   . Colon cancer Neg Hx     Social History Social History   Tobacco Use  . Smoking status: Former Research scientist (life sciences)  . Smokeless tobacco: Never Used  . Tobacco comment: Quit 1989  Substance Use Topics  . Alcohol use:  No    Alcohol/week: 0.0 standard drinks  . Drug use: No     Allergies   Penicillins and Sulfa antibiotics   Review of Systems Review of Systems   Physical Exam Triage Vital Signs ED Triage Vitals [11/18/18 0826]  Enc Vitals Group     BP (!) 170/82     Pulse Rate 82     Resp 16     Temp 98.7 F (37.1 C)     Temp src      SpO2 100 %     Weight      Height      Head Circumference      Peak Flow      Pain Score 0     Pain Loc      Pain Edu?      Excl. in [place]?    No data found.  Updated Vital Signs BP (!) 170/82   Pulse 82   Temp 98.7 F (37.1 C)    Resp 16   SpO2 100%   Physical Exam Constitutional:      General: She is not in acute distress.    Appearance: She is well-developed.  HENT:     Head: Normocephalic and atraumatic.     Right Ear: Tympanic membrane, ear canal and external ear normal.     Left Ear: Tympanic membrane, ear canal and external ear normal.     Nose: Nose normal.     Mouth/Throat:     Pharynx: Uvula midline.     Tonsils: No tonsillar exudate.  Eyes:     Conjunctiva/sclera: Conjunctivae normal.     Pupils: Pupils are equal, round, and reactive to light.  Cardiovascular:     Rate and Rhythm: Normal rate and regular rhythm.     Heart sounds: Normal heart sounds.  Pulmonary:     Effort: Pulmonary effort is normal.     Breath sounds: Normal breath sounds. No decreased breath sounds.  Chest:     Chest wall: No tenderness.  Skin:    General: Skin is warm and dry.  Neurological:     Mental Status: She is alert and oriented to person, place, and time.    EKG:  NSR 72 . Previous EKG was available for review. No stwave changes as interpreted by me.    UC Treatments / Results  Labs (all labs ordered are listed, but only abnormal results are displayed) Labs Reviewed - No data to display  EKG None  Radiology Dg Chest 2 View  Result Date: 11/18/2018 CLINICAL DATA:  Shortness of breath. EXAM: CHEST - 2 VIEW COMPARISON:  11/06/2014 FINDINGS: The heart size and mediastinal contours are within normal limits. Both lungs are clear. The visualized skeletal structures are unremarkable. IMPRESSION: Normal exam. Electronically Signed   By: Lorriane Shire M.D.   On: 11/18/2018 09:20    Procedures Procedures (including critical care time)  Medications Ordered in UC Medications - No data to display  Initial Impression / Assessment and Plan / UC Course  I have reviewed the triage vital signs and the nursing notes.  Pertinent labs & imaging results that were available during my care of the patient were reviewed by  me and considered in my medical decision making (see chart for details).     Non toxic, afebrile. No increased work of breathing. No current symptoms. ekg and chest xray reassuring. Anxiety vs allergic/asthmatic vs acs vs pe discussed and considered. Strict return precautions. Encouraged to take prescribed BP and  anxiety medications. Patient verbalized understanding and agreeable to plan.  Ambulatory out of clinic without difficulty.   Final Clinical Impressions(s) / UC Diagnoses   Final diagnoses:  Anxiety  Seasonal allergic rhinitis, unspecified trigger  Feeling of chest tightness     Discharge Instructions     Your EKG and chest xray are normal today.  We will start a daily allergy medication.  Use of inhaler as needed for wheezing or shortness of breath.   Please start your amitripyline as previously prescribed to help with your anxiety.  If you develop worse or persistent chest pain or tightness, nausea, sweating, , shortness of breath , arm or jaw pain or otherwise worsening please go to the ER.    ED Prescriptions    Medication Sig Dispense Auth. Provider   cetirizine (ZYRTEC) 10 MG tablet Take 1 tablet (10 mg total) by mouth daily. 30 tablet Augusto Gamble B, NP   albuterol (PROAIR HFA) 108 (90 Base) MCG/ACT inhaler Inhale 1-2 puffs into the lungs every 6 (six) hours as needed for wheezing or shortness of breath. 1 Inhaler Zigmund Gottron, NP     Controlled Substance Prescriptions [place] Controlled Substance Registry consulted? Not Applicable   Zigmund Gottron, NP 11/18/18 [phone]

## 2018-11-18 NOTE — ED Triage Notes (Signed)
Pt describes chest tightness for past few weeks, and states it feels hard to take a deep breath, denies cough or fever, admits to feeling anxious

## 2018-11-18 NOTE — Discharge Instructions (Signed)
Your EKG and chest xray are normal today.  We will start a daily allergy medication.  Use of inhaler as needed for wheezing or shortness of breath.   Please start your amitripyline as previously prescribed to help with your anxiety.  If you develop worse or persistent chest pain or tightness, nausea, sweating, , shortness of breath , arm or jaw pain or otherwise worsening please go to the ER.

## 2018-12-07 ENCOUNTER — Encounter: Payer: Self-pay | Admitting: Nurse Practitioner

## 2018-12-09 ENCOUNTER — Other Ambulatory Visit: Payer: Self-pay | Admitting: Nurse Practitioner

## 2018-12-09 DIAGNOSIS — F419 Anxiety disorder, unspecified: Secondary | ICD-10-CM

## 2019-01-13 ENCOUNTER — Other Ambulatory Visit: Payer: Self-pay | Admitting: Nurse Practitioner

## 2019-01-25 ENCOUNTER — Other Ambulatory Visit: Payer: Self-pay | Admitting: Internal Medicine

## 2019-01-25 DIAGNOSIS — Z1231 Encounter for screening mammogram for malignant neoplasm of breast: Secondary | ICD-10-CM

## 2019-02-04 ENCOUNTER — Ambulatory Visit: Payer: BC Managed Care – PPO | Admitting: Internal Medicine

## 2019-02-16 ENCOUNTER — Other Ambulatory Visit: Payer: Self-pay

## 2019-02-16 DIAGNOSIS — Z20822 Contact with and (suspected) exposure to covid-19: Secondary | ICD-10-CM

## 2019-02-17 LAB — NOVEL CORONAVIRUS, NAA: SARS-CoV-2, NAA: NOT DETECTED

## 2019-03-09 ENCOUNTER — Other Ambulatory Visit: Payer: Self-pay

## 2019-03-09 ENCOUNTER — Ambulatory Visit
Admission: RE | Admit: 2019-03-09 | Discharge: 2019-03-09 | Disposition: A | Discharge status: Home / Self Care | Payer: BC Managed Care – PPO | Source: Ambulatory Visit | Attending: Internal Medicine | Admitting: Internal Medicine

## 2019-03-09 DIAGNOSIS — Z1231 Encounter for screening mammogram for malignant neoplasm of breast: Secondary | ICD-10-CM

## 2019-03-15 ENCOUNTER — Encounter: Payer: Self-pay | Admitting: Nurse Practitioner

## 2019-03-15 ENCOUNTER — Ambulatory Visit: Payer: BC Managed Care – PPO | Admitting: Nurse Practitioner

## 2019-03-15 ENCOUNTER — Other Ambulatory Visit: Payer: Self-pay

## 2019-03-15 ENCOUNTER — Other Ambulatory Visit: Payer: Self-pay | Admitting: Nurse Practitioner

## 2019-03-15 VITALS — BP 128/72 | HR 81 | Temp 98.1°F | Ht 58.8 in | Wt 152.0 lb

## 2019-03-15 DIAGNOSIS — I10 Essential (primary) hypertension: Secondary | ICD-10-CM | POA: Diagnosis not present

## 2019-03-15 DIAGNOSIS — E039 Hypothyroidism, unspecified: Secondary | ICD-10-CM | POA: Diagnosis not present

## 2019-03-15 DIAGNOSIS — F419 Anxiety disorder, unspecified: Secondary | ICD-10-CM

## 2019-03-15 MED ORDER — HYDROCHLOROTHIAZIDE 12.5 MG PO TABS: 12.5000 mg | ORAL_TABLET | Freq: Every day | ORAL | 1 refills | Status: DC

## 2019-03-15 NOTE — Patient Instructions (Signed)
Preventing Influenza, Adult Influenza, more commonly known as "the flu," is a viral infection that mainly affects the respiratory tract. The respiratory tract includes structures that help you breathe, such as the lungs, nose, and throat. The flu causes many common cold symptoms, as well as a high fever and body aches. The flu spreads easily from person to person (is contagious). The flu is most common from December through March. This is called flu season.You can catch the flu virus by:  Breathing in droplets from an infected person's cough or sneeze.  Touching something that was recently contaminated with the virus and then touching your mouth, nose, or eyes. What can I do to lower my risk?        You can decrease your risk of getting the flu by:  Getting a flu shot (influenza vaccination) every year. This is the best way to prevent the flu. A flu shot is recommended for everyone age 6 months and older. ? It is best to get a flu shot in the fall, as soon as it is available. Getting a flu shot during winter or spring instead is still a good idea. Flu season can last into early spring. ? Preventing the flu through vaccination requires getting a new flu shot every year. This is because the flu virus changes slightly (mutates) from one year to the next. Even if a flu shot does not completely protect you from all flu virus mutations, it can reduce the severity of your illness and prevent dangerous complications of the flu. ? If you are pregnant, you can and should get a flu shot. ? If you have had a reaction to the shot in the past or if you are allergic to eggs, check with your health care provider before getting a flu shot. ? Sometimes the vaccine is available as a nasal spray. In some years, the nasal spray has not been as effective against the flu virus. Check with your health care provider if you have questions about this.  Practicing good health habits. This is especially important during  flu season. ? Avoid contact with people who are sick with flu or cold symptoms. ? Wash your hands with soap and water often. If soap and water are not available, use alcohol-based hand sanitizer. ? Avoid touching your hands to your face, especially when you have not washed your hands recently. ? Use a disinfectant to clean surfaces at home and at work that may be contaminated with the flu virus. ? Keep your body's disease-fighting system (immune system) in good shape by eating a healthy diet, drinking plenty of fluids, getting enough sleep, and exercising regularly. If you do get the flu, avoid spreading it to others by:  Staying home until your symptoms have been gone for at least one day.  Covering your mouth and nose when you cough or sneeze.  Avoiding close contact with others, especially babies and elderly people. Why are these changes important? Getting a flu shot and practicing good health habits protects you as well as other people. If you get the flu, your friends, family, and co-workers are also at risk of getting it, because it spreads so easily to others. Each year, about 2 out of every 10 people get the flu. Having the flu can lead to complications, such as pneumonia, ear infection, and sinus infection. The flu also can be deadly, especially for babies, people older than age 65, and people who have serious long-term diseases. How is this treated? Most   people recover from the flu by resting at home and drinking plenty of fluids. However, a prescription antiviral medicine may reduce your flu symptoms and may make your flu go away sooner. This medicine must be started within a few days of getting flu symptoms. You can talk with your health care provider about whether you need an antiviral medicine. Antiviral medicine may be prescribed for people who are at risk for more serious flu symptoms. This includes people who:  Are older than age 65.  Are pregnant.  Have a condition that  makes the flu worse or more dangerous. Where to find more information  Centers for Disease Control and Prevention: www.cdc.gov/flu/index.htm  Flu.gov: www.flu.gov/prevention-vaccination  American Academy [employer]icians: familydoctor.org/familydoctor/en/kids/vaccines/preventing-the-flu.html Contact a health care provider if:  You have influenza and you develop new symptoms.  You have: ? Chest pain. ? Diarrhea. ? A fever.  Your cough gets worse, or you produce more mucus. Summary  The best way to prevent the flu is to get a flu shot every year in the fall.  Even if you get the flu after you have received the yearly vaccine, your flu may be milder and go away sooner because of your flu shot.  If you get the flu, antiviral medicines that are started with a few days of symptoms may reduce your flu symptoms and may make your flu go away sooner.  You can also help prevent the flu by practicing good health habits. This information is not intended to replace advice given to you by your health care provider. Make sure you discuss any questions you have with your health care provider. Document Released: 07/16/2015 Document Revised: 06/13/2017 Document Reviewed: 03/09/2016 Elsevier Patient Education  2020 Elsevier Inc.  

## 2019-03-15 NOTE — Progress Notes (Signed)
Subjective:     Patient ID: Tamara West , female    DOB: 1955/08/18 , 63 y.o.   MRN: [id number]   Chief Complaint  Patient presents with  . Hypertension  . Thyroid Problem    HPI  Hyperlipemia - she is taking atorvastatin without any concerns.  She was seen at the dentist on 03/01/2019 - her blood pressure was up to 167/97, 178/90 and 189/101.  She was unable to get her feelings in her teeth.  She will need to have her gums numb.  She has an appt today at 330pm  Wt Readings from Last 3 Encounters: 03/15/19 : 152 lb (68.9 kg) 08/06/18 : 149 lb 9.6 oz (67.9 kg) 04/06/15 : 176 lb (79.8 kg)   Hypertension This is a chronic problem. The current episode started more than 1 year ago. The problem is unchanged (slightly elevated today reports was 140/80. she has not taken her blood pressure medications yet). The problem is controlled. Pertinent negatives include no anxiety, chest pain, headaches or palpitations. There are no associated agents to hypertension. Risk factors for coronary artery disease include sedentary lifestyle and obesity. Past treatments include angiotensin blockers. There are no compliance problems.  Identifiable causes of hypertension include a thyroid problem. There is no history of chronic renal disease.  Thyroid Problem Presents for follow-up visit. Patient reports no anxiety, fatigue, palpitations or weight gain. The symptoms have been stable.     Past Medical History:  Diagnosis Date  . Anxiety   . Depression   . Hyperlipidemia   . Hypertension   . Thyroid disease      Family History  Problem Relation Age of Onset  . Stroke Father   . Hypertension Sister   . Hypertension Brother   . Colon cancer Neg Hx   . Breast cancer Neg Hx      Current Outpatient Medications:  .  albuterol (PROAIR HFA) 108 (90 Base) MCG/ACT inhaler, Inhale 1-2 puffs into the lungs every 6 (six) hours as needed for wheezing or shortness of breath., Disp: 1 Inhaler, Rfl: 0 .   amitriptyline (ELAVIL) 10 MG tablet, TAKE 1 TABLET BY MOUTH EVERYDAY AT BEDTIME, Disp: 90 tablet, Rfl: 1 .  cetirizine (ZYRTEC) 10 MG tablet, Take 1 tablet (10 mg total) by mouth daily., Disp: 30 tablet, Rfl: 0 .  levothyroxine (SYNTHROID, LEVOTHROID) 25 MCG tablet, TAKE 1 TABLET (25 MCG TOTAL) BY MOUTH DAILY BEFORE BREAKFAST., Disp: 90 tablet, Rfl: 1 .  mometasone (NASONEX) 50 MCG/ACT nasal spray, Place 2 sprays into the nose daily., Disp: 17 g, Rfl: 2 .  olmesartan (BENICAR) 20 MG tablet, TAKE 1 TABLET BY MOUTH EVERY DAY, Disp: 90 tablet, Rfl: 1 .  simvastatin (ZOCOR) 20 MG tablet, TAKE 1 TABLET BY MOUTH EVERYDAY AT BEDTIME, Disp: 30 tablet, Rfl: 2   Allergies  Allergen Reactions  . Penicillins Swelling  . Sulfa Antibiotics Other (See Comments)    knots     Review of Systems  Constitutional: Negative for fatigue and weight gain.  HENT: Negative.   Respiratory: Negative.   Cardiovascular: Negative for chest pain and palpitations.  Neurological: Negative for dizziness and headaches.  Psychiatric/Behavioral: Negative.  The patient is not nervous/anxious.      Today's Vitals   03/15/19 0927  BP: 128/72  Pulse: 81  Temp: 98.1 F (36.7 C)  TempSrc: Oral  SpO2: 98%  Weight: 152 lb (68.9 kg)  Height: 4' 10.8" (1.494 m)   Body mass index is 30.91 kg/m.  Objective:  Physical Exam Vitals signs reviewed.  Constitutional:      Appearance: Normal appearance.  Cardiovascular:     Rate and Rhythm: Normal rate and regular rhythm.     Pulses: Normal pulses.     Heart sounds: Normal heart sounds. No murmur.  Pulmonary:     Effort: Pulmonary effort is normal. No respiratory distress.     Breath sounds: Normal breath sounds.  Musculoskeletal:     Right lower leg: No edema.     Left lower leg: No edema.  Skin:    General: Skin is warm and dry.     Capillary Refill: Capillary refill takes less than 2 seconds.  Neurological:     General: No focal deficit present.     Mental  Status: She is alert and oriented to person, place, and time.  Psychiatric:        Mood and Affect: Mood normal.        Behavior: Behavior normal.        Thought Content: Thought content normal.        Judgment: Judgment normal.     Comments: She is in much better spirits today compared to her last visit         Assessment And Plan:     1. Acquired hypothyroidism  Chronic, controlled  Continue with current medications - TSH - T3 - T4, Free  2. Essential (primary) hypertension  Separated out her HCTZ and olmesartan  Her blood pressure is controlled and she is cleared to have a dental procedure today  Encouraged to avoid high salt foods.  - hydrochlorothiazide (HYDRODIURIL) 12.5 MG tablet; Take 1 tablet (12.5 mg total) by mouth daily.  Dispense: 90 tablet; Refill: 1 - CMP14 + Anion Gap  3. Anxiety  Chronic, she is doing better since being on amitryptilline  I have recommended that this patient have a flu shot but she declines at this time. I have discussed the risks and benefits of this procedure with her. The patient verbalizes understanding.  Minette Brine, FNP    THE PATIENT IS ENCOURAGED TO PRACTICE SOCIAL DISTANCING DUE TO THE COVID-19 PANDEMIC.

## 2019-03-16 LAB — CMP14 + ANION GAP
ALT: 13 IU/L (ref 0–32)
AST: 19 IU/L (ref 0–40)
Albumin/Globulin Ratio: 1.6 (ref 1.2–2.2)
Albumin: 4.7 g/dL (ref 3.8–4.8)
Alkaline Phosphatase: 92 IU/L (ref 39–117)
Anion Gap: 14 mmol/L (ref 10.0–18.0)
BUN/Creatinine Ratio: 15 (ref 12–28)
BUN: 14 mg/dL (ref 8–27)
Bilirubin Total: 0.2 mg/dL (ref 0.0–1.2)
CO2: 25 mmol/L (ref 20–29)
Calcium: 10.6 mg/dL — ABNORMAL HIGH (ref 8.7–10.3)
Chloride: 102 mmol/L (ref 96–106)
Creatinine, Ser: 0.92 mg/dL (ref 0.57–1.00)
GFR calc Af Amer: 77 mL/min/{1.73_m2} (ref >59)
GFR calc non Af Amer: 66 mL/min/{1.73_m2} (ref >59)
Globulin, Total: 3 g/dL (ref 1.5–4.5)
Glucose: 81 mg/dL (ref 65–99)
Potassium: 4.4 mmol/L (ref 3.5–5.2)
Sodium: 141 mmol/L (ref 134–144)
Total Protein: 7.7 g/dL (ref 6.0–8.5)

## 2019-03-16 LAB — T4, FREE: Free T4: 1.21 ng/dL (ref 0.82–1.77)

## 2019-03-16 LAB — TSH: TSH: 2.26 u[IU]/mL (ref 0.450–4.500)

## 2019-03-16 LAB — T3: T3, Total: 88 ng/dL (ref 71–180)

## 2019-04-18 ENCOUNTER — Other Ambulatory Visit: Payer: Self-pay | Admitting: Nurse Practitioner

## 2019-04-28 ENCOUNTER — Other Ambulatory Visit: Payer: Self-pay | Admitting: Nurse Practitioner

## 2019-04-28 DIAGNOSIS — E039 Hypothyroidism, unspecified: Secondary | ICD-10-CM

## 2019-06-25 ENCOUNTER — Other Ambulatory Visit: Payer: Self-pay

## 2019-06-25 DIAGNOSIS — Z20822 Contact with and (suspected) exposure to covid-19: Secondary | ICD-10-CM

## 2019-06-27 LAB — NOVEL CORONAVIRUS, NAA: SARS-CoV-2, NAA: NOT DETECTED

## 2019-07-19 ENCOUNTER — Telehealth: Payer: Self-pay

## 2019-07-19 NOTE — Telephone Encounter (Signed)
PT LVM WANTING TO VERIFY NEXT APPT. ATT TO CONTACT PT NO ANS UNABLE TO LVM MAILBOX FULL

## 2019-07-19 NOTE — Telephone Encounter (Signed)
SPOKE WITH PT. PT AWARE OF NEXT APPT

## 2019-08-05 ENCOUNTER — Ambulatory Visit
Admission: EM | Admit: 2019-08-05 | Discharge: 2019-08-05 | Disposition: A | Discharge status: Home / Self Care | Payer: BC Managed Care – PPO

## 2019-08-09 ENCOUNTER — Other Ambulatory Visit: Payer: Self-pay

## 2019-08-09 ENCOUNTER — Encounter: Payer: Self-pay | Admitting: Emergency Medicine

## 2019-08-09 ENCOUNTER — Ambulatory Visit
Admission: EM | Admit: 2019-08-09 | Discharge: 2019-08-09 | Disposition: A | Discharge status: Home / Self Care | Payer: BC Managed Care – PPO | Attending: Emergency Medicine | Admitting: Emergency Medicine

## 2019-08-09 DIAGNOSIS — Z20822 Contact with and (suspected) exposure to covid-19: Secondary | ICD-10-CM | POA: Diagnosis not present

## 2019-08-09 DIAGNOSIS — R519 Headache, unspecified: Secondary | ICD-10-CM

## 2019-08-09 DIAGNOSIS — B349 Viral infection, unspecified: Secondary | ICD-10-CM | POA: Diagnosis not present

## 2019-08-09 DIAGNOSIS — I10 Essential (primary) hypertension: Secondary | ICD-10-CM

## 2019-08-09 NOTE — ED Provider Notes (Addendum)
[name]    CSN: [id number] Arrival date & time: 08/09/19  0913      History   Chief Complaint Chief Complaint  Patient presents with  . Exposure to COVID    HPI Tamara West is a 64 y.o. female with history of hypertension, thyroid disease    Presenting for Covid testing: Exposure: sister who tested positive 08/02/19 Date of exposure: mon 1/18 for hours w/ masks on most of the time Any fever, symptoms since exposure: yes - generalized, mild and intermittent weakness, fatigue, frontal headaches, chills, scratchy throat.  Has taken APAP w/ relief of symptoms.  Patient denies fever, myalgias, dizziness, chest pain, palpitations, cough, SOB, abdominal pain, vomiting, diarrhea.  Patient does endorse some anxiety related to sister's covid diagnosis: no SI/HI.  Reports good appetite/oral intake.     Past Medical History:  Diagnosis Date  . Anxiety   . Depression   . Hyperlipidemia   . Hypertension   . Thyroid disease     Patient Active Problem List   Diagnosis Date Noted  . Essential (primary) hypertension 08/06/2018  . Non-seasonal allergic rhinitis 08/06/2018  . Hypothyroidism 08/06/2018    Past Surgical History:  Procedure Laterality Date  . ABDOMINAL HYSTERECTOMY    . COLONOSCOPY  2004   w/Dr.Mann=hemorrhoid     OB History   No obstetric history on file.      Home Medications    Prior to Admission medications   Medication Sig Start Date End Date Taking? Authorizing Provider  albuterol (PROAIR HFA) 108 (90 Base) MCG/ACT inhaler Inhale 1-2 puffs into the lungs every 6 (six) hours as needed for wheezing or shortness of breath. 11/18/18   Zigmund Gottron, NP  amitriptyline (ELAVIL) 10 MG tablet TAKE 1 TABLET BY MOUTH EVERYDAY AT BEDTIME 12/09/18   Minette Brine, FNP  cetirizine (ZYRTEC) 10 MG tablet Take 1 tablet (10 mg total) by mouth daily. 11/18/18   Zigmund Gottron, NP  hydrochlorothiazide (HYDRODIURIL) 12.5 MG tablet Take 1 tablet (12.5 mg  total) by mouth daily. 03/15/19   Minette Brine, FNP  levothyroxine (SYNTHROID) 25 MCG tablet TAKE 1 TABLET (25 MCG TOTAL) BY MOUTH DAILY BEFORE BREAKFAST. 04/28/19   Minette Brine, FNP  mometasone (NASONEX) 50 MCG/ACT nasal spray Place 2 sprays into the nose daily. 08/06/18 08/06/19  Minette Brine, FNP  olmesartan (BENICAR) 20 MG tablet TAKE 1 TABLET BY MOUTH EVERY DAY 03/15/19   Minette Brine, FNP  simvastatin (ZOCOR) 20 MG tablet TAKE 1 TABLET BY MOUTH EVERYDAY AT BEDTIME 04/19/19   Minette Brine, FNP    Family History Family History  Problem Relation Age of Onset  . Stroke Father   . Hypertension Sister   . Hypertension Brother   . Colon cancer Neg Hx   . Breast cancer Neg Hx     Social History Social History   Tobacco Use  . Smoking status: Former Research scientist (life sciences)  . Smokeless tobacco: Never Used  . Tobacco comment: Quit 1989  Substance Use Topics  . Alcohol use: No    Alcohol/week: 0.0 standard drinks  . Drug use: No     Allergies   Penicillins and Sulfa antibiotics   Review of Systems As per HPI   Physical Exam Triage Vital Signs ED Triage Vitals  Enc Vitals Group     BP      Pulse      Resp      Temp      Temp src  SpO2      Weight      Height      Head Circumference      Peak Flow      Pain Score      Pain Loc      Pain Edu?      Excl. in [place]?    No data found.  Updated Vital Signs BP (!) 176/89 (BP Location: Left Arm)   Pulse (!) 103 Comment: Taken by APP during assessment  Temp 98 F (36.7 C) (Temporal)   Resp 16   SpO2 99%   Visual Acuity Right Eye Distance:   Left Eye Distance:   Bilateral Distance:    Right Eye Near:   Left Eye Near:    Bilateral Near:     Physical Exam Constitutional:      General: She is not in acute distress.    Appearance: She is normal weight. She is not ill-appearing, toxic-appearing or diaphoretic.  HENT:     Head: Normocephalic and atraumatic.     Mouth/Throat:     Mouth: Mucous membranes are moist.      Pharynx: Oropharynx is clear. No oropharyngeal exudate or posterior oropharyngeal erythema.  Eyes:     General: No scleral icterus.    Conjunctiva/sclera: Conjunctivae normal.     Pupils: Pupils are equal, round, and reactive to light.  Neck:     Comments: Trachea midline, negative JVD Cardiovascular:     Rate and Rhythm: Regular rhythm. Tachycardia present.     Heart sounds: No murmur. No gallop.   Pulmonary:     Effort: Pulmonary effort is normal. No respiratory distress.     Breath sounds: No stridor. No wheezing, rhonchi or rales.  Musculoskeletal:        General: Normal range of motion.     Cervical back: Neck supple. No tenderness.     Right lower leg: No edema.     Left lower leg: No edema.  Lymphadenopathy:     Cervical: No cervical adenopathy.  Skin:    Capillary Refill: Capillary refill takes less than 2 seconds.     Coloration: Skin is not jaundiced or pale.  Neurological:     General: No focal deficit present.     Mental Status: She is alert and oriented to person, place, and time.  Psychiatric:     Comments: Patient appears mildly anxious, though consolable.  Pleasant affect      UC Treatments / Results  Labs (all labs ordered are listed, but only abnormal results are displayed) Labs Reviewed  NOVEL CORONAVIRUS, NAA    EKG   Radiology No results found.  Procedures Procedures (including critical care time)  Medications Ordered in UC Medications - No data to display  Initial Impression / Assessment and Plan / UC Course  I have reviewed the triage vital signs and the nursing notes.  Pertinent labs & imaging results that were available during my care of the patient were reviewed by me and considered in my medical decision making (see chart for details).    I have reviewed the triage vital signs and the nursing notes.  All pertinent labs & imaging results that were available during my care of the patient were reviewed by me and considered in my  medical decision making (see chart for details).  Patient afebrile, nontoxic, with SpO2 99%.  Covid PCR pending.  No focal cardiopulmonary or neurocognitive findings on exam today.  Patient mildly tachycardic, though endorsing anxiety regarding sisters Covid  diagnosis and her test pending today.  Patient to quarantine until results are back.  We will continue supportive management.  Given patient's age/comorbidities this provider discussed that she may be eligible for outpatient infusion therapy should her Covid test be positive: Patient agreeable to her information being provided to infusion clinic if she test positive.  Return precautions discussed, patient verbalized understanding and is agreeable to plan. Final Clinical Impressions(s) / UC Diagnoses   Final diagnoses:  Exposure to COVID-19 virus  Frontal headache  Viral illness     Discharge Instructions     Your COVID test is pending - it is important to quarantine / isolate at home until your results are back. If you test positive and would like further evaluation for persistent or worsening symptoms, you may schedule an E-visit or virtual (video) visit throughout the [facility] app or website.  PLEASE NOTE: If you develop severe chest pain or shortness of breath please go to the ER or call [phone] for further evaluation --> DO NOT schedule electronic or virtual visits for this. Please call our office for further guidance / recommendations as needed.  For information about the Covid vaccine, please visit [email or web address]    ED Prescriptions    None     PDMP not reviewed this encounter.   Hall-Potvin, [place], PA-C 08/09/19 [id number]    Neldon Mc Reynolds, [place] 08/09/19 1007

## 2019-08-09 NOTE — ED Triage Notes (Signed)
PT presents to [facility] for assessment after being exposed to her sister who was positive for COVID on 08/02/19.  Patient c/o weakness, fatigue, headaches, chills, nausea, stuffy nose, scratchy throat, cough

## 2019-08-09 NOTE — Discharge Instructions (Addendum)
Your COVID test is pending - it is important to quarantine / isolate at home until your results are back. °If you test positive and would like further evaluation for persistent or worsening symptoms, you may schedule an E-visit or virtual (video) visit throughout the [facility] MyChart app or website. ° °PLEASE NOTE: If you develop severe chest pain or shortness of breath please go to the ER or call 9-1-1 for further evaluation --> DO NOT schedule electronic or virtual visits for this. °Please call our office for further guidance / recommendations as needed. ° °For information about the Covid vaccine, please visit [email or web address] °

## 2019-08-09 NOTE — ED Notes (Signed)
Patient able to ambulate independently  

## 2019-08-10 ENCOUNTER — Telehealth (HOSPITAL_COMMUNITY): Payer: Self-pay | Admitting: Emergency Medicine

## 2019-08-10 LAB — NOVEL CORONAVIRUS, NAA: SARS-CoV-2, NAA: DETECTED — AB

## 2019-08-10 NOTE — Telephone Encounter (Signed)

## 2019-08-11 ENCOUNTER — Telehealth: Payer: Self-pay | Admitting: Nurse Practitioner

## 2019-08-11 NOTE — Telephone Encounter (Signed)
Called to Discuss with patient about Covid symptoms and the use of bamlanivimab, a monoclonal antibody infusion for those with mild to moderate Covid symptoms and at a high risk of hospitalization.     Pt states that symptoms started 2 weeks ago.

## 2019-08-12 ENCOUNTER — Other Ambulatory Visit: Payer: Self-pay

## 2019-08-12 ENCOUNTER — Telehealth (INDEPENDENT_AMBULATORY_CARE_PROVIDER_SITE_OTHER): Payer: BC Managed Care – PPO | Admitting: Nurse Practitioner

## 2019-08-12 ENCOUNTER — Encounter: Payer: Self-pay | Admitting: Nurse Practitioner

## 2019-08-12 VITALS — BP 141/80 | HR 72 | Temp 98.5°F | Ht 60.0 in | Wt 151.0 lb

## 2019-08-12 DIAGNOSIS — R05 Cough: Secondary | ICD-10-CM | POA: Diagnosis not present

## 2019-08-12 DIAGNOSIS — U071 COVID-19: Secondary | ICD-10-CM

## 2019-08-12 DIAGNOSIS — R059 Cough, unspecified: Secondary | ICD-10-CM

## 2019-08-12 DIAGNOSIS — B342 Coronavirus infection, unspecified: Secondary | ICD-10-CM

## 2019-08-12 MED ORDER — AZITHROMYCIN 250 MG PO TABS: ORAL_TABLET | ORAL | 0 refills | Status: AC

## 2019-08-12 NOTE — Progress Notes (Signed)
Virtual Visit via Telephone   This visit type was conducted due to national recommendations for restrictions regarding the COVID-19 Pandemic (e.g. social distancing) in an effort to limit this patient's exposure and mitigate transmission in our community.  Due to her co-morbid illnesses, this patient is at least at moderate risk for complications without adequate follow up.  This format is felt to be most appropriate for this patient at this time.  All issues noted in this document were discussed and addressed.  A limited physical exam was performed with this format.    This visit type was conducted due to national recommendations for restrictions regarding the COVID-19 Pandemic (e.g. social distancing) in an effort to limit this patient's exposure and mitigate transmission in our community.  Patients identity confirmed using two different identifiers.  This format is felt to be most appropriate for this patient at this time.  All issues noted in this document were discussed and addressed.  No physical exam was performed (except for noted visual exam findings with Video Visits).    Date:  08/15/2019   ID:  Tamara West, DOB 03/17/1956, MRN [id number]  Patient Location:  Home - spoke with Tamara West  Provider location:   Office    Chief Complaint:  Positive covid test unable to smell  History of Present Illness:    Tamara West is a 64 y.o. female who presents via video conferencing for a telehealth visit today.    The patient does have symptoms concerning for COVID-19 infection (fever, chills, cough, or new shortness of breath).   She was with her sister last Monday then her sister called her Tuesday and made her aware she tested positive. Once she found out she began feeling "strange", she went to Urgent care on Thursday and was advised to come back on Monday - this is when had a positive test.  She is having weakness, chills, loss of smell and coughed up a little bit of blood  Tuesday. She has nasal congestion now.  Her left leg had weakness and runny nose. Denies having a fever. Soreness in her eyes.  Pressure on her shoulders. Denies shortness or breath.  She had headaches prior to today.  She is taking Tylenol and continues her regular medications.  She has vitamin c and elderberry.      Past Medical History:  Diagnosis Date  . Anxiety   . Depression   . Hyperlipidemia   . Hypertension   . Thyroid disease    Past Surgical History:  Procedure Laterality Date  . ABDOMINAL HYSTERECTOMY    . COLONOSCOPY  2004   w/Dr.Mann=hemorrhoid      Current Meds  Medication Sig  . Ascorbic Acid (VITAMIN C PO) Take by mouth. Take one tablet daily  . ASPIRIN 81 PO Take by mouth. Take one tablet daily  . levothyroxine (SYNTHROID) 25 MCG tablet TAKE 1 TABLET (25 MCG TOTAL) BY MOUTH DAILY BEFORE BREAKFAST.  Marland Kitchen olmesartan (BENICAR) 20 MG tablet TAKE 1 TABLET BY MOUTH EVERY DAY  . simvastatin (ZOCOR) 20 MG tablet TAKE 1 TABLET BY MOUTH EVERYDAY AT BEDTIME     Allergies:   Penicillins and Sulfa antibiotics   Social History   Tobacco Use  . Smoking status: Former Research scientist (life sciences)  . Smokeless tobacco: Never Used  . Tobacco comment: Quit 1989  Substance Use Topics  . Alcohol use: No    Alcohol/week: 0.0 standard drinks  . Drug use: No     Family Hx: The  patient's family history includes Hypertension in her brother and sister; Stroke in her father. There is no history of Colon cancer or Breast cancer.  ROS:   Please see the history of present illness.    Review of Systems  Constitutional: Negative.  Negative for chills.  HENT:       Unable to smell  Respiratory: Positive for cough. Negative for shortness of breath and wheezing.   Cardiovascular: Negative.   Neurological: Positive for headaches (prior to today). Negative for dizziness.  Psychiatric/Behavioral: Negative.     All other systems reviewed and are negative.   Labs/Other Tests and Data Reviewed:    Recent  Labs: 03/15/2019: ALT 13; BUN 14; Creatinine, Ser 0.92; Potassium 4.4; Sodium 141; TSH 2.260   Recent Lipid Panel Lab Results  Component Value Date/Time   CHOL 226 (H) 08/06/2018 10:47 AM   TRIG 87 08/06/2018 10:47 AM   HDL 61 08/06/2018 10:47 AM   CHOLHDL 3.7 08/06/2018 10:47 AM   LDLCALC 148 (H) 08/06/2018 10:47 AM    Wt Readings from Last 3 Encounters:  08/12/19 151 lb (68.5 kg)  03/15/19 152 lb (68.9 kg)  08/06/18 149 lb 9.6 oz (67.9 kg)     Exam:    Vital Signs:  BP (!) 141/80 (BP Location: Left Arm, Patient Position: Sitting, Cuff Size: Small)   Pulse 72   Temp 98.5 F (36.9 C) (Oral)   Ht 5' (1.524 m)   Wt 151 lb (68.5 kg)   BMI 29.49 kg/m     Physical Exam  Constitutional: She is oriented to person, place, and time and well-developed, well-nourished, and in no distress. No distress.  Pulmonary/Chest: No respiratory distress.  Neurological: She is alert and oriented to person, place, and time.  Psychiatric: Mood, memory, affect and judgment normal.    ASSESSMENT & PLAN:    1. Coronavirus infection   she is at day number 9 with her symptoms  Will refer to remote health to make a visit and evaluate particularly due to her chronic illness history  Reviewed urgent care note in Epic - azithromycin (ZITHROMAX) 250 MG tablet; Take 2 tablets (500 mg) on  Day 1,  followed by 1 tablet (250 mg) once daily on Days 2 through 5.  Dispense: 6 each; Refill: 0  2. Cough  Treated with azithromycin due to her having blood in her sputum to treat for possible pneumonia  I will have Remote Health to make a visit  COVID-19 Education: The signs and symptoms of COVID-19 were discussed with the patient and how to seek care for testing (follow up with PCP or arrange E-visit).  The importance of social distancing was discussed today.  Patient Risk:   After full review of this patients clinical status, I feel that they are at least moderate risk at this time.  Time:   Today, I  have spent 11 minutes/ seconds with the patient with telehealth technology discussing above diagnoses.     Medication Adjustments/Labs and Tests Ordered: Current medicines are reviewed at length with the patient today.  Concerns regarding medicines are outlined above.   Tests Ordered: No orders of the defined types were placed in this encounter.   Medication Changes: Meds ordered this encounter  Medications  . azithromycin (ZITHROMAX) 250 MG tablet    Sig: Take 2 tablets (500 mg) on  Day 1,  followed by 1 tablet (250 mg) once daily on Days 2 through 5.    Dispense:  6 each  Refill:  0    Disposition:  Follow up prn  Signed, Minette Brine, FNP

## 2019-08-13 ENCOUNTER — Telehealth: Payer: Self-pay | Admitting: Adult Health

## 2019-08-13 NOTE — Telephone Encounter (Signed)
Called to discuss with Tamara West about Covid symptoms and the use of bamlanivimab, a monoclonal antibody infusion for those with mild to moderate Covid symptoms and at a high risk of hospitalization.     Pt is qualified for this infusion at the [facility] infusion center due to co-morbid conditions and/or a member of an at-risk group,  Symptoms tier reviewed as well as criteria for ending isolation.  Symptoms reviewed that would warrant ED/Hospital evaluation. Preventative practices reviewed. Patient verbalized understanding.   Callback number to the infusion center given. Patient advised to go to Urgent care or ED with severe symptoms. Last date she  would be eligible for infusion is 08/14/19 . She was placed on wait list as no current openings available.   .      Patient Active Problem List   Diagnosis Date Noted  . Essential (primary) hypertension 08/06/2018  . Non-seasonal allergic rhinitis 08/06/2018  . Hypothyroidism 08/06/2018    Tamara Yin NP-C  [facility]  [phone]  08/13/2019

## 2019-08-14 ENCOUNTER — Telehealth: Payer: Self-pay | Admitting: Nurse Practitioner

## 2019-08-14 ENCOUNTER — Other Ambulatory Visit: Payer: Self-pay | Admitting: Nurse Practitioner

## 2019-08-14 ENCOUNTER — Ambulatory Visit (HOSPITAL_COMMUNITY)
Admission: RE | Admit: 2019-08-14 | Discharge: 2019-08-14 | Disposition: A | Discharge status: Home / Self Care | Payer: BC Managed Care – PPO | Source: Ambulatory Visit | Attending: Pulmonary Disease | Admitting: Pulmonary Disease

## 2019-08-14 DIAGNOSIS — U071 COVID-19: Secondary | ICD-10-CM | POA: Insufficient documentation

## 2019-08-14 DIAGNOSIS — I10 Essential (primary) hypertension: Secondary | ICD-10-CM | POA: Diagnosis not present

## 2019-08-14 MED ORDER — SODIUM CHLORIDE 0.9 % IV SOLN
INTRAVENOUS | Status: DC | PRN
  Administered 2019-08-14: 250 mL via INTRAVENOUS

## 2019-08-14 MED ORDER — ALBUTEROL SULFATE HFA 108 (90 BASE) MCG/ACT IN AERS: 2.0000 | INHALATION_SPRAY | Freq: Once | RESPIRATORY_TRACT | Status: DC | PRN

## 2019-08-14 MED ORDER — DIPHENHYDRAMINE HCL 50 MG/ML IJ SOLN: 50.0000 mg | Freq: Once | INTRAMUSCULAR | Status: DC | PRN

## 2019-08-14 MED ORDER — METHYLPREDNISOLONE SODIUM SUCC 125 MG IJ SOLR: 125.0000 mg | Freq: Once | INTRAMUSCULAR | Status: DC | PRN

## 2019-08-14 MED ORDER — FAMOTIDINE IN NACL 20-0.9 MG/50ML-% IV SOLN: 20.0000 mg | Freq: Once | INTRAVENOUS | Status: DC | PRN

## 2019-08-14 MED ORDER — SODIUM CHLORIDE 0.9 % IV SOLN
700.0000 mg | Freq: Once | INTRAVENOUS | Status: AC
  Administered 2019-08-14: 700 mg via INTRAVENOUS
  Filled 2019-08-14: qty 20

## 2019-08-14 MED ORDER — EPINEPHRINE 0.3 MG/0.3ML IJ SOAJ: 0.3000 mg | Freq: Once | INTRAMUSCULAR | Status: DC | PRN

## 2019-08-14 NOTE — Discharge Instructions (Signed)

## 2019-08-14 NOTE — Telephone Encounter (Signed)
Patient requested a call to further discuss her medical conditions and current medications as she considers receiving Monoclonal antibody infusion in the setting of COVID + with symptoms (coughs, aches, fatigue, headaches, sore throat, and nausea).   Patient concerned about contraindications with recent history of taking oral steroid. Education provided and patient aware she may proceed safely with infusion if she chose to. She verbalized understanding and expressed wishes to proceed with infusion if possible.   Instructed patient (as advised by Infusion team RN) to arrive for infusion today by 11:00am. Information given regarding appointment details. Patient verbalized understanding and is preparing to arrive for appointment.

## 2019-08-14 NOTE — Progress Notes (Signed)
  I connected by phone with Tamara West on 08/14/2019 at 10:15 AM to discuss the potential use of an new treatment for mild to moderate COVID-19 viral infection in non-hospitalized patients.  This patient is a 64 y.o. female that meets the FDA criteria for Emergency Use Authorization of bamlanivimab or casirivimab\imdevimab.  Has a (+) direct SARS-CoV-2 viral test result  Has mild or moderate COVID-19   Is ? 65 years of age and weighs ? 40 kg  Is NOT hospitalized due to COVID-19  Is NOT requiring oxygen therapy or requiring an increase in baseline oxygen flow rate due to COVID-19  Is within 10 days of symptom onset  Has at least one of the high risk factor(s) for progression to severe COVID-19 and/or hospitalization as defined in EUA.  Specific high risk criteria : Hypertension   I have spoken and communicated the following to the patient or parent/caregiver:  1. FDA has authorized the emergency use of bamlanivimab and casirivimab\imdevimab for the treatment of mild to moderate COVID-19 in adults and pediatric patients with positive results of direct SARS-CoV-2 viral testing who are 54 years of age and older weighing at least 40 kg, and who are at high risk for progressing to severe COVID-19 and/or hospitalization.  2. The significant known and potential risks and benefits of bamlanivimab and casirivimab\imdevimab, and the extent to which such potential risks and benefits are unknown.  3. Information on available alternative treatments and the risks and benefits of those alternatives, including clinical trials.  4. Patients treated with bamlanivimab and casirivimab\imdevimab should continue to self-isolate and use infection control measures (e.g., wear mask, isolate, social distance, avoid sharing personal items, clean and disinfect "high touch" surfaces, and frequent handwashing) according to CDC guidelines.   5. The patient or parent/caregiver has the option to accept or refuse  bamlanivimab or casirivimab\imdevimab .  After reviewing this information with the patient, The patient agreed to proceed with receiving the bamlanimivab infusion and will be provided a copy of the Fact sheet prior to receiving the infusion.   Nikki Pickenpack-Cousar 08/14/2019 10:15 AM

## 2019-08-14 NOTE — Progress Notes (Signed)
  Diagnosis: COVID-19  Physician:dr Joya Gaskins  Procedure: [facility] Infusion [facility] Med: bamlanivimab infusion - Provided patient with bamlanimivab fact sheet for patients, parents and caregivers prior to infusion.  Complications: No immediate complications noted.  Discharge: Discharged home   Dewaine Oats 08/14/2019

## 2019-09-02 ENCOUNTER — Encounter: Payer: BC Managed Care – PPO | Admitting: Nurse Practitioner

## 2019-09-16 ENCOUNTER — Other Ambulatory Visit: Payer: Self-pay

## 2019-09-16 ENCOUNTER — Encounter: Payer: Self-pay | Admitting: Nurse Practitioner

## 2019-09-16 ENCOUNTER — Ambulatory Visit: Payer: BC Managed Care – PPO | Admitting: Nurse Practitioner

## 2019-09-16 VITALS — BP 132/80 | HR 70 | Temp 98.5°F | Ht <= 58 in | Wt 158.8 lb

## 2019-09-16 DIAGNOSIS — Z0000 Encounter for general adult medical examination without abnormal findings: Secondary | ICD-10-CM | POA: Diagnosis not present

## 2019-09-16 DIAGNOSIS — E6609 Other obesity due to excess calories: Secondary | ICD-10-CM

## 2019-09-16 DIAGNOSIS — E66811 Obesity, class 1: Secondary | ICD-10-CM

## 2019-09-16 DIAGNOSIS — I10 Essential (primary) hypertension: Secondary | ICD-10-CM

## 2019-09-16 DIAGNOSIS — Z6833 Body mass index (BMI) 33.0-33.9, adult: Secondary | ICD-10-CM

## 2019-09-16 DIAGNOSIS — Z8616 Personal history of COVID-19: Secondary | ICD-10-CM

## 2019-09-16 DIAGNOSIS — E039 Hypothyroidism, unspecified: Secondary | ICD-10-CM

## 2019-09-16 LAB — POCT URINALYSIS DIPSTICK
Bilirubin, UA: NEGATIVE
Blood, UA: NEGATIVE
Glucose, UA: NEGATIVE
Ketones, UA: NEGATIVE
Nitrite, UA: NEGATIVE
Protein, UA: NEGATIVE
Spec Grav, UA: 1.025 (ref 1.010–1.025)
Urobilinogen, UA: 0.2 E.U./dL
pH, UA: 5.5 (ref 5.0–8.0)

## 2019-09-16 LAB — POCT UA - MICROALBUMIN
Albumin/Creatinine Ratio, Urine, POC: 30
Creatinine, POC: 300 mg/dL
Microalbumin Ur, POC: 10 mg/L

## 2019-09-16 NOTE — Patient Instructions (Signed)
Health Maintenance  Topic Date Due  . INFLUENZA VACCINE  10/13/2019 (Originally 02/13/2019)  . COLONOSCOPY  04/04/2020  . MAMMOGRAM  03/08/2021  . TETANUS/TDAP  01/29/2024  . Hepatitis C Screening  Completed  . HIV Screening  Completed  . PAP SMEAR-Modifier  Discontinued   Health Maintenance, Female Adopting a healthy lifestyle and getting preventive care are important in promoting health and wellness. Ask your health care provider about:  The right schedule for you to have regular tests and exams.  Things you can do on your own to prevent diseases and keep yourself healthy. What should I know about diet, weight, and exercise? Eat a healthy diet   Eat a diet that includes plenty of vegetables, fruits, low-fat dairy products, and lean protein.  Do not eat a lot of foods that are high in solid fats, added sugars, or sodium. Maintain a healthy weight Body mass index (BMI) is used to identify weight problems. It estimates body fat based on height and weight. Your health care provider can help determine your BMI and help you achieve or maintain a healthy weight. Get regular exercise Get regular exercise. This is one of the most important things you can do for your health. Most adults should:  Exercise for at least 150 minutes each week. The exercise should increase your heart rate and make you sweat (moderate-intensity exercise).  Do strengthening exercises at least twice a week. This is in addition to the moderate-intensity exercise.  Spend less time sitting. Even light physical activity can be beneficial. Watch cholesterol and blood lipids Have your blood tested for lipids and cholesterol at 65 years of age, then have this test every 5 years. Have your cholesterol levels checked more often if:  Your lipid or cholesterol levels are high.  You are older than 65 years of age.  You are at high risk for heart disease. What should I know about cancer screening? Depending on your  health history and family history, you may need to have cancer screening at various ages. This may include screening for:  Breast cancer.  Cervical cancer.  Colorectal cancer.  Skin cancer.  Lung cancer. What should I know about heart disease, diabetes, and high blood pressure? Blood pressure and heart disease  High blood pressure causes heart disease and increases the risk of stroke. This is more likely to develop in people who have high blood pressure readings, are of African descent, or are overweight.  Have your blood pressure checked: ? Every 3-5 years if you are 9-38 years of age. ? Every year if you are 85 years old or older. Diabetes Have regular diabetes screenings. This checks your fasting blood sugar level. Have the screening done:  Once every three years after age 3 if you are at a normal weight and have a low risk for diabetes.  More often and at a younger age if you are overweight or have a high risk for diabetes. What should I know about preventing infection? Hepatitis B If you have a higher risk for hepatitis B, you should be screened for this virus. Talk with your health care provider to find out if you are at risk for hepatitis B infection. Hepatitis C Testing is recommended for:  Everyone born from [birth year] through 1966.  Anyone with known risk factors for hepatitis C. Sexually transmitted infections (STIs)  Get screened for STIs, including gonorrhea and chlamydia, if: ? You are sexually active and are younger than 65 years of age. ? You are  older than 65 years of age and your health care provider tells you that you are at risk for this type of infection. ? Your sexual activity has changed since you were last screened, and you are at increased risk for chlamydia or gonorrhea. Ask your health care provider if you are at risk.  Ask your health care provider about whether you are at high risk for HIV. Your health care provider may recommend a prescription  medicine to help prevent HIV infection. If you choose to take medicine to prevent HIV, you should first get tested for HIV. You should then be tested every 3 months for as long as you are taking the medicine. Pregnancy  If you are about to stop having your period (premenopausal) and you may become pregnant, seek counseling before you get pregnant.  Take 400 to 800 micrograms (mcg) of folic acid every day if you become pregnant.  Ask for birth control (contraception) if you want to prevent pregnancy. Osteoporosis and menopause Osteoporosis is a disease in which the bones lose minerals and strength with aging. This can result in bone fractures. If you are 60 years old or older, or if you are at risk for osteoporosis and fractures, ask your health care provider if you should:  Be screened for bone loss.  Take a calcium or vitamin D supplement to lower your risk of fractures.  Be given hormone replacement therapy (HRT) to treat symptoms of menopause. Follow these instructions at home: Lifestyle  Do not use any products that contain nicotine or tobacco, such as cigarettes, e-cigarettes, and chewing tobacco. If you need help quitting, ask your health care provider.  Do not use street drugs.  Do not share needles.  Ask your health care provider for help if you need support or information about quitting drugs. Alcohol use  Do not drink alcohol if: ? Your health care provider tells you not to drink. ? You are pregnant, may be pregnant, or are planning to become pregnant.  If you drink alcohol: ? Limit how much you use to 0-1 drink a day. ? Limit intake if you are breastfeeding.  Be aware of how much alcohol is in your drink. In the [place] one drink equals one 12 oz bottle of beer (355 mL), one 5 oz glass of wine (148 mL), or one 1 oz glass of hard liquor (44 mL). General instructions  Schedule regular health, dental, and eye exams.  Stay current with your vaccines.  Tell your health  care provider if: ? You often feel depressed. ? You have ever been abused or do not feel safe at home. Summary  Adopting a healthy lifestyle and getting preventive care are important in promoting health and wellness.  Follow your health care provider's instructions about healthy diet, exercising, and getting tested or screened for diseases.  Follow your health care provider's instructions on monitoring your cholesterol and blood pressure. This information is not intended to replace advice given to you by your health care provider. Make sure you discuss any questions you have with your health care provider. Document Revised: 06/24/2018 Document Reviewed: 06/24/2018 Elsevier Patient Education  [employer].  COVID-19 Vaccine Information can be found at: ShippingScam.co.uk For questions related to vaccine distribution or appointments, please email [email or web address] or call [phone].

## 2019-09-16 NOTE — Progress Notes (Signed)
This visit occurred during the SARS-CoV-2 public health emergency.  Safety protocols were in place, including screening questions prior to the visit, additional usage of staff PPE, and extensive cleaning of exam room while observing appropriate contact time as indicated for disinfecting solutions.  Subjective:     Patient ID: Tamara West , female    DOB: August 08, 1955 , 64 y.o.   MRN: [id number]   Chief Complaint  Patient presents with  . Annual Exam    HPI  Here for HM   Recently recovered from covid  Wt Readings from Last 3 Encounters: 08/12/19 : 151 lb (68.5 kg) 03/15/19 : 152 lb (68.9 kg) 08/06/18 : 149 lb 9.6 oz (67.9 kg)    Hypertension This is a chronic problem. The current episode started more than 1 year ago. The problem is unchanged. The problem is controlled. Pertinent negatives include no anxiety. There are no associated agents to hypertension. There are no known risk factors for coronary artery disease. Past treatments include ACE inhibitors. There are no compliance problems.  There is no history of angina. Identifiable causes of hypertension include a thyroid problem.  Thyroid Problem Presents for follow-up visit. Patient reports no anxiety or fatigue. The symptoms have been resolved.    The patient states she is status post hysterectomy  Mammogram last done 03/10/2019.  Negative for: breast discharge, breast lump(s), breast pain and breast self exam.  Pertinent negatives include abnormal bleeding (hematology), anxiety, decreased libido, depression, difficulty falling sleep, dyspareunia, history of infertility, nocturia, sexual dysfunction, sleep disturbances, urinary incontinence, urinary urgency, vaginal discharge and vaginal itching. Diet regular eating more.  The patient states her exercise level is minimal.      The patient's tobacco use is:  Social History   Tobacco Use  Smoking Status Former Smoker  Smokeless Tobacco Never Used  Tobacco Comment   Quit 1989    She has been exposed to passive smoke. The patient's alcohol use is:  Social History   Substance and Sexual Activity  Alcohol Use No  . Alcohol/week: 0.0 standard drinks  Additional information: Last pap hysterectomy  Past Medical History:  Diagnosis Date  . Anxiety   . Depression   . Hyperlipidemia   . Hypertension   . Thyroid disease      Family History  Problem Relation Age of Onset  . Stroke Father   . Hypertension Sister   . Hypertension Brother   . Colon cancer Neg Hx   . Breast cancer Neg Hx      Current Outpatient Medications:  .  Ascorbic Acid (VITAMIN C PO), Take by mouth. Take one tablet daily, Disp: , Rfl:  .  ASPIRIN 81 PO, Take by mouth. Take one tablet daily, Disp: , Rfl:  .  hydrochlorothiazide (HYDRODIURIL) 12.5 MG tablet, Take 1 tablet (12.5 mg total) by mouth daily., Disp: 90 tablet, Rfl: 1 .  levothyroxine (SYNTHROID) 25 MCG tablet, TAKE 1 TABLET (25 MCG TOTAL) BY MOUTH DAILY BEFORE BREAKFAST., Disp: 90 tablet, Rfl: 1 .  olmesartan (BENICAR) 20 MG tablet, TAKE 1 TABLET BY MOUTH EVERY DAY, Disp: 90 tablet, Rfl: 1 .  simvastatin (ZOCOR) 20 MG tablet, TAKE 1 TABLET BY MOUTH EVERYDAY AT BEDTIME, Disp: 90 tablet, Rfl: 0 .  VITAMIN D PO, Take by mouth. Take 1 tablet daily, Disp: , Rfl:    Allergies  Allergen Reactions  . Penicillins Swelling  . Sulfa Antibiotics Other (See Comments)    knots     Review of Systems  Constitutional:  Negative.  Negative for fatigue.  HENT: Negative.   Eyes: Negative.   Respiratory: Negative.   Cardiovascular: Negative.   Gastrointestinal: Negative.   Endocrine: Negative.   Genitourinary: Negative.   Musculoskeletal: Negative.   Skin: Negative.   Allergic/Immunologic: Negative.   Neurological: Negative.   Hematological: Negative.   Psychiatric/Behavioral: Negative.  The patient is not nervous/anxious.      Today's Vitals   09/16/19 1021  BP: 132/80  PainSc: 0-No pain   There is no height or weight on file  to calculate BMI.   Objective:  Physical Exam Constitutional:      Appearance: Normal appearance. She is well-developed.  HENT:     Head: Normocephalic and atraumatic.     Right Ear: Hearing, tympanic membrane, ear canal and external ear normal. There is no impacted cerumen.     Left Ear: Hearing, tympanic membrane, ear canal and external ear normal. There is no impacted cerumen.     Nose:     Comments: Deferred - mask    Mouth/Throat:     Comments: Deferred - mask Eyes:     General: Lids are normal.     Extraocular Movements: Extraocular movements intact.     Conjunctiva/sclera: Conjunctivae normal.     Pupils: Pupils are equal, round, and reactive to light.     Funduscopic exam:    Right eye: No papilledema.        Left eye: No papilledema.  Neck:     Thyroid: No thyroid mass.     Vascular: No carotid bruit.  Cardiovascular:     Rate and Rhythm: Normal rate and regular rhythm.     Pulses: Normal pulses.     Heart sounds: Normal heart sounds. No murmur.  Pulmonary:     Effort: Pulmonary effort is normal. No respiratory distress.     Breath sounds: Normal breath sounds.  Chest:     Breasts:        Right: Normal. No mass or tenderness.        Left: Normal. No mass or tenderness.  Abdominal:     General: Abdomen is flat. Bowel sounds are normal. There is no distension.     Palpations: Abdomen is soft.  Musculoskeletal:        General: No swelling. Normal range of motion.     Cervical back: Full passive range of motion without pain, normal range of motion and neck supple.     Right lower leg: No edema.     Left lower leg: No edema.  Lymphadenopathy:     Upper Body:     Right upper body: No supraclavicular or axillary adenopathy.     Left upper body: No supraclavicular or axillary adenopathy.  Skin:    General: Skin is warm and dry.     Capillary Refill: Capillary refill takes less than 2 seconds.  Neurological:     General: No focal deficit present.     Mental  Status: She is alert and oriented to person, place, and time.     Cranial Nerves: No cranial nerve deficit.     Sensory: No sensory deficit.  Psychiatric:        Mood and Affect: Mood normal.        Behavior: Behavior normal.        Thought Content: Thought content normal.        Judgment: Judgment normal.         Assessment And Plan:     1. Health  maintenance examination Behavior modifications discussed and diet history reviewed.   Pt will continue to exercise regularly and modify diet with low GI, plant based foods and decrease intake of processed foods.  Recommend intake of daily multivitamin, Vitamin D, and calcium.  Recommend mammogram (due in August) for preventive screenings, as well as recommend immunizations that include influenza, TDAP   2. Essential (primary) hypertension  Chronic, good control  She has not been taking the HCTZ I have advised her to not take daily.   EKG done with NSR HR 71 - POCT Urinalysis Dipstick (81002) - POCT UA - Microalbumin - EKG 12-Lead  3. Acquired hypothyroidism  Chronic, stable  Continue with current medications  4. History of COVID-19  She was treated with the infusion  Discussed the importance of continuing to wear her mask  She is advised to wait   5. Class 1 obesity due to excess calories without serious comorbidity with body mass index (BMI) of 33.0 to 33.9 in adult  She is planning to start back exercising  Goal to exercise 30 minutes a day 5 days a week.   Minette Brine, FNP    THE PATIENT IS ENCOURAGED TO PRACTICE SOCIAL DISTANCING DUE TO THE COVID-19 PANDEMIC.

## 2019-09-17 LAB — LIPID PANEL
Chol/HDL Ratio: 3 ratio (ref 0.0–4.4)
Cholesterol, Total: 181 mg/dL (ref 100–199)
HDL: 61 mg/dL (ref >39)
LDL Chol Calc (NIH): 109 mg/dL — ABNORMAL HIGH (ref 0–99)
Triglycerides: 58 mg/dL (ref 0–149)
VLDL Cholesterol Cal: 11 mg/dL (ref 5–40)

## 2019-09-17 LAB — CMP14+EGFR
ALT: 20 IU/L (ref 0–32)
AST: 25 IU/L (ref 0–40)
Albumin/Globulin Ratio: 1.6 (ref 1.2–2.2)
Albumin: 4.4 g/dL (ref 3.8–4.8)
Alkaline Phosphatase: 94 IU/L (ref 39–117)
BUN/Creatinine Ratio: 16 (ref 12–28)
BUN: 13 mg/dL (ref 8–27)
Bilirubin Total: <0.2 mg/dL (ref 0.0–1.2)
CO2: 25 mmol/L (ref 20–29)
Calcium: 10.3 mg/dL (ref 8.7–10.3)
Chloride: 102 mmol/L (ref 96–106)
Creatinine, Ser: 0.83 mg/dL (ref 0.57–1.00)
GFR calc Af Amer: 86 mL/min/{1.73_m2} (ref >59)
GFR calc non Af Amer: 75 mL/min/{1.73_m2} (ref >59)
Globulin, Total: 2.7 g/dL (ref 1.5–4.5)
Glucose: 73 mg/dL (ref 65–99)
Potassium: 4.4 mmol/L (ref 3.5–5.2)
Sodium: 141 mmol/L (ref 134–144)
Total Protein: 7.1 g/dL (ref 6.0–8.5)

## 2019-09-17 LAB — HEMOGLOBIN A1C
Est. average glucose Bld gHb Est-mCnc: 114 mg/dL
Hgb A1c MFr Bld: 5.6 % (ref 4.8–5.6)

## 2019-09-17 LAB — TSH: TSH: 3.6 u[IU]/mL (ref 0.450–4.500)

## 2019-09-17 LAB — CBC
Hematocrit: 35.9 % (ref 34.0–46.6)
Hemoglobin: 11.7 g/dL (ref 11.1–15.9)
MCH: 29.3 pg (ref 26.6–33.0)
MCHC: 32.6 g/dL (ref 31.5–35.7)
MCV: 90 fL (ref 79–97)
Platelets: 261 10*3/uL (ref 150–450)
RBC: 4 x10E6/uL (ref 3.77–5.28)
RDW: 12.8 % (ref 11.7–15.4)
WBC: 6.6 10*3/uL (ref 3.4–10.8)

## 2019-09-17 LAB — T3, FREE: T3, Free: 2.4 pg/mL (ref 2.0–4.4)

## 2019-09-17 LAB — T4: T4, Total: 8.2 ug/dL (ref 4.5–12.0)

## 2019-09-22 ENCOUNTER — Other Ambulatory Visit: Payer: Self-pay | Admitting: Nurse Practitioner

## 2019-09-23 ENCOUNTER — Other Ambulatory Visit: Payer: Self-pay | Admitting: Nurse Practitioner

## 2019-11-01 ENCOUNTER — Other Ambulatory Visit: Payer: Self-pay | Admitting: Nurse Practitioner

## 2019-11-01 DIAGNOSIS — E039 Hypothyroidism, unspecified: Secondary | ICD-10-CM

## 2020-01-20 ENCOUNTER — Ambulatory Visit: Payer: BC Managed Care – PPO | Admitting: Nurse Practitioner

## 2020-01-27 ENCOUNTER — Other Ambulatory Visit: Payer: Self-pay | Admitting: Internal Medicine

## 2020-01-27 DIAGNOSIS — Z1231 Encounter for screening mammogram for malignant neoplasm of breast: Secondary | ICD-10-CM

## 2020-01-29 ENCOUNTER — Other Ambulatory Visit: Payer: Self-pay | Admitting: Nurse Practitioner

## 2020-01-31 ENCOUNTER — Other Ambulatory Visit: Payer: Self-pay

## 2020-01-31 ENCOUNTER — Ambulatory Visit: Payer: BC Managed Care – PPO | Admitting: Nurse Practitioner

## 2020-01-31 ENCOUNTER — Other Ambulatory Visit: Payer: Self-pay | Admitting: Nurse Practitioner

## 2020-01-31 ENCOUNTER — Encounter: Payer: Self-pay | Admitting: Nurse Practitioner

## 2020-01-31 VITALS — BP 136/78 | HR 68 | Temp 98.3°F | Ht 59.0 in | Wt 160.8 lb

## 2020-01-31 DIAGNOSIS — I10 Essential (primary) hypertension: Secondary | ICD-10-CM | POA: Diagnosis not present

## 2020-01-31 DIAGNOSIS — E039 Hypothyroidism, unspecified: Secondary | ICD-10-CM

## 2020-01-31 DIAGNOSIS — R0789 Other chest pain: Secondary | ICD-10-CM

## 2020-01-31 DIAGNOSIS — Z8616 Personal history of COVID-19: Secondary | ICD-10-CM | POA: Diagnosis not present

## 2020-01-31 DIAGNOSIS — R233 Spontaneous ecchymoses: Secondary | ICD-10-CM

## 2020-01-31 NOTE — Progress Notes (Addendum)
Subjective:     Patient ID: Tamara West , female    DOB: [birth year] , 64 y.o.   MRN: [id number]   Chief Complaint  Patient presents with  . Hypertension  . Hypothyroidism    HPI  Hyperlipemia - she is taking atorvastatin without any concerns.  She was seen at the dentist on 03/01/2019 - her blood pressure was up to 167/97, 178/90 and 189/101.  She was unable to get her feelings in her teeth.  She will need to have her gums numb.  She has an appt today at 330pm  Wt Readings from Last 3 Encounters: 03/15/19 : 152 lb (68.9 kg) 08/06/18 : 149 lb 9.6 oz (67.9 kg) 04/06/15 : 176 lb (79.8 kg)   Hypertension This is a chronic problem. The current episode started more than 1 year ago. The problem is unchanged (slightly elevated today reports was 140/80. she has not taken her blood pressure medications yet). The problem is controlled. Pertinent negatives include no anxiety, chest pain, headaches or palpitations. There are no associated agents to hypertension. Risk factors for coronary artery disease include sedentary lifestyle and obesity. Past treatments include angiotensin blockers. There are no compliance problems.  Identifiable causes of hypertension include a thyroid problem. There is no history of chronic renal disease.  Thyroid Problem Presents for follow-up visit. Patient reports no anxiety, fatigue, palpitations or weight gain. The symptoms have been stable.     Past Medical History:  Diagnosis Date  . Anxiety   . Depression   . Hyperlipidemia   . Hypertension   . Thyroid disease      Family History  Problem Relation Age of Onset  . Stroke Father   . Hypertension Sister   . Hypertension Brother   . Colon cancer Neg Hx   . Breast cancer Neg Hx      Current Outpatient Medications:  .  Ascorbic Acid (VITAMIN C PO), Take by mouth. Take one tablet daily, Disp: , Rfl:  .  ASPIRIN 81 PO, Take by mouth. Take one tablet daily, Disp: , Rfl:  .  hydrochlorothiazide  (HYDRODIURIL) 12.5 MG tablet, Take 1 tablet (12.5 mg total) by mouth daily., Disp: 90 tablet, Rfl: 1 .  levothyroxine (SYNTHROID) 25 MCG tablet, TAKE 1 TABLET (25 MCG TOTAL) BY MOUTH DAILY BEFORE BREAKFAST., Disp: 90 tablet, Rfl: 1 .  olmesartan (BENICAR) 20 MG tablet, TAKE 1 TABLET BY MOUTH EVERY DAY, Disp: 90 tablet, Rfl: 1 .  simvastatin (ZOCOR) 20 MG tablet, TAKE 1 TABLET BY MOUTH EVERYDAY AT BEDTIME, Disp: 90 tablet, Rfl: 0 .  VITAMIN D PO, Take by mouth. Take 1 tablet daily, Disp: , Rfl:    Allergies  Allergen Reactions  . Penicillins Swelling  . Sulfa Antibiotics Other (See Comments)    knots     Review of Systems  Constitutional: Negative for fatigue and weight gain.  HENT: Negative.   Respiratory: Negative.   Cardiovascular: Negative for chest pain, palpitations and leg swelling.  Neurological: Negative for dizziness and headaches.  Psychiatric/Behavioral: Negative.  The patient is not nervous/anxious.      Today's Vitals   01/31/20 1143  BP: 136/78  Pulse: 68  Temp: 98.3 F (36.8 C)  TempSrc: Oral  Weight: 160 lb 12.8 oz (72.9 kg)  Height: '4\' 11"'  (1.499 m)  PainSc: 0-No pain   Body mass index is 32.48 kg/m.   Objective:  Physical Exam Vitals reviewed.  Constitutional:      General: She is not in acute  distress.    Appearance: Normal appearance. She is obese.  Cardiovascular:     Rate and Rhythm: Normal rate and regular rhythm.     Pulses: Normal pulses.     Heart sounds: Normal heart sounds. No murmur heard.   Pulmonary:     Effort: Pulmonary effort is normal. No respiratory distress.     Breath sounds: Normal breath sounds.  Musculoskeletal:     Right lower leg: No edema.     Left lower leg: No edema.  Skin:    General: Skin is warm and dry.     Capillary Refill: Capillary refill takes less than 2 seconds.  Neurological:     General: No focal deficit present.     Mental Status: She is alert and oriented to person, place, and time.  Psychiatric:         Mood and Affect: Mood normal.        Behavior: Behavior normal.        Thought Content: Thought content normal.        Judgment: Judgment normal.         Assessment And Plan:     1. Essential (primary) hypertension  Chronic, fair control   Continue with current medications  2. Acquired hypothyroidism  Chronic, controlled  Continue with current medications  Reminded to take on an empty stomach  3. Spontaneous bruising  She has scattered darkened pigmented bruising to left medial aspect thigh - Vitamin D (25 hydroxy) - Vitamin B12 - CMP14+EGFR - CBC  4. History of COVID-19  - Ambulatory referral to Pulmonology  5. Chest tightness  Will refer to pulmonology due to history of Covid, she will use an inhaler with relief.  - Ambulatory referral to Pulmonology     Minette Brine, FNP    THE PATIENT IS ENCOURAGED TO PRACTICE SOCIAL DISTANCING DUE TO THE COVID-19 PANDEMIC.

## 2020-02-01 ENCOUNTER — Telehealth: Payer: Self-pay | Admitting: Nurse Practitioner

## 2020-02-01 LAB — CMP14+EGFR
ALT: 26 IU/L (ref 0–32)
AST: 28 IU/L (ref 0–40)
Albumin/Globulin Ratio: 1.6 (ref 1.2–2.2)
Albumin: 4.9 g/dL — ABNORMAL HIGH (ref 3.8–4.8)
Alkaline Phosphatase: 103 IU/L (ref 48–121)
BUN/Creatinine Ratio: 17 (ref 12–28)
BUN: 15 mg/dL (ref 8–27)
Bilirubin Total: <0.2 mg/dL (ref 0.0–1.2)
CO2: 22 mmol/L (ref 20–29)
Calcium: 10.9 mg/dL — ABNORMAL HIGH (ref 8.7–10.3)
Chloride: 104 mmol/L (ref 96–106)
Creatinine, Ser: 0.87 mg/dL (ref 0.57–1.00)
GFR calc Af Amer: 81 mL/min/{1.73_m2} (ref >59)
GFR calc non Af Amer: 71 mL/min/{1.73_m2} (ref >59)
Globulin, Total: 3.1 g/dL (ref 1.5–4.5)
Glucose: 76 mg/dL (ref 65–99)
Potassium: 4.8 mmol/L (ref 3.5–5.2)
Sodium: 145 mmol/L — ABNORMAL HIGH (ref 134–144)
Total Protein: 8 g/dL (ref 6.0–8.5)

## 2020-02-01 LAB — CBC
Hematocrit: 39.6 % (ref 34.0–46.6)
Hemoglobin: 12.5 g/dL (ref 11.1–15.9)
MCH: 28.7 pg (ref 26.6–33.0)
MCHC: 31.6 g/dL (ref 31.5–35.7)
MCV: 91 fL (ref 79–97)
Platelets: 235 10*3/uL (ref 150–450)
RBC: 4.36 x10E6/uL (ref 3.77–5.28)
RDW: 12.2 % (ref 11.7–15.4)
WBC: 6.6 10*3/uL (ref 3.4–10.8)

## 2020-02-01 LAB — VITAMIN B12: Vitamin B-12: 1075 pg/mL (ref 232–1245)

## 2020-02-01 LAB — VITAMIN D 25 HYDROXY (VIT D DEFICIENCY, FRACTURES): Vit D, 25-Hydroxy: 47.7 ng/mL (ref 30.0–100.0)

## 2020-02-01 NOTE — Telephone Encounter (Signed)
Patient called states that she received a bill from [facility] regarding her physical on 09/16/19 her T3 was send with the Z00.00 called spoke with Danae Chen at [facility] and changed code to R94.6 for that date of service. Please allow 30-45 to reprocess claim

## 2020-02-03 ENCOUNTER — Other Ambulatory Visit: Payer: Self-pay

## 2020-02-03 ENCOUNTER — Telehealth: Payer: Self-pay

## 2020-02-03 MED ORDER — HYDROXYZINE HCL 25 MG PO TABS: 25.0000 mg | ORAL_TABLET | Freq: Three times a day (TID) | ORAL | 0 refills | Status: DC | PRN

## 2020-02-03 NOTE — Telephone Encounter (Signed)
Patient called stating she is not feeling well and her brain is going 100 miles on her. I called pt and advised her that Tamara West has added labs and we will call her once we get the results and also we have sent in a prescription For her to take as needed. YL,RMA

## 2020-02-09 ENCOUNTER — Other Ambulatory Visit: Payer: Self-pay

## 2020-02-09 DIAGNOSIS — E039 Hypothyroidism, unspecified: Secondary | ICD-10-CM

## 2020-02-10 LAB — TSH: TSH: 3.18 u[IU]/mL (ref 0.450–4.500)

## 2020-02-10 LAB — T3, FREE: T3, Free: 2.4 pg/mL (ref 2.0–4.4)

## 2020-02-10 LAB — T4: T4, Total: 7.5 ug/dL (ref 4.5–12.0)

## 2020-02-16 ENCOUNTER — Other Ambulatory Visit: Payer: Self-pay | Admitting: Nurse Practitioner

## 2020-02-16 DIAGNOSIS — E039 Hypothyroidism, unspecified: Secondary | ICD-10-CM

## 2020-03-10 ENCOUNTER — Other Ambulatory Visit: Payer: Self-pay

## 2020-03-10 ENCOUNTER — Ambulatory Visit
Admission: RE | Admit: 2020-03-10 | Discharge: 2020-03-10 | Disposition: A | Discharge status: Home / Self Care | Payer: BC Managed Care – PPO | Source: Ambulatory Visit | Attending: Internal Medicine | Admitting: Internal Medicine

## 2020-03-10 DIAGNOSIS — Z1231 Encounter for screening mammogram for malignant neoplasm of breast: Secondary | ICD-10-CM

## 2020-03-16 ENCOUNTER — Institutional Professional Consult (permissible substitution): Payer: BC Managed Care – PPO | Admitting: Pulmonary Disease

## 2020-03-22 ENCOUNTER — Institutional Professional Consult (permissible substitution): Payer: BC Managed Care – PPO | Admitting: Emergency Medicine

## 2020-04-13 ENCOUNTER — Ambulatory Visit (INDEPENDENT_AMBULATORY_CARE_PROVIDER_SITE_OTHER): Payer: BC Managed Care – PPO

## 2020-04-13 ENCOUNTER — Encounter: Payer: Self-pay | Admitting: Emergency Medicine

## 2020-04-13 ENCOUNTER — Ambulatory Visit (INDEPENDENT_AMBULATORY_CARE_PROVIDER_SITE_OTHER): Payer: BC Managed Care – PPO | Admitting: Emergency Medicine

## 2020-04-13 ENCOUNTER — Other Ambulatory Visit: Payer: Self-pay

## 2020-04-13 VITALS — BP 124/72 | HR 72 | Temp 98.3°F | Ht 61.0 in | Wt 165.0 lb

## 2020-04-13 DIAGNOSIS — R0600 Dyspnea, unspecified: Secondary | ICD-10-CM | POA: Diagnosis not present

## 2020-04-13 DIAGNOSIS — R0789 Other chest pain: Secondary | ICD-10-CM | POA: Diagnosis not present

## 2020-04-13 NOTE — Progress Notes (Signed)
Subjective:    Patient ID: Tamara West, female    DOB: 08-13-1955, 64 y.o.   MRN: [id number]  HPI 64 year old woman with a minimal tobacco history (1.5 pack years), hyperlipidemia, hypertension, hypothyroidism, asthma as a young adult - seemed to grow out it.  She was diagnosed with COVID-19 pneumonitis in January 2021.  She received monoclonal antibody infusion and was managed as an outpatient. She had aches, HA, cough and mucous, lost smell. She has improved, still has some residual brain fog, resp sx as below. No CXR done when she had COVID.  She is here today for evaluation of residual chest tightness, some exertional SOB that she can work through, able to continue to exert but more difficult. She has an albuterol, but has not tried it. No real cough, has a lot of sneezing, clear nasal gtt. Some sinus HA lately.    Review of Systems As per HPI  Past Medical History:  Diagnosis Date  . Anxiety   . Depression   . Hyperlipidemia   . Hypertension   . Thyroid disease      Family History  Problem Relation Age of Onset  . Stroke Father   . Hypertension Sister   . Hypertension Brother   . Colon cancer Neg Hx   . Breast cancer Neg Hx      Social History   Socioeconomic History  . Marital status: Widowed    Spouse name: Not on file  . Number of children: 4  . Years of education: 64  . Highest education level: Not on file  Occupational History  . Occupation: International aid/development worker: [employer]  Tobacco Use  . Smoking status: Former Smoker    Packs/day: 0.50    Years: 3.00    Pack years: 1.50    Quit date: 1989    Years since quitting: 32.7  . Smokeless tobacco: Never Used  . Tobacco comment: Quit 1989  Substance and Sexual Activity  . Alcohol use: No    Alcohol/week: 0.0 standard drinks  . Drug use: No  . Sexual activity: Never  Other Topics Concern  . Not on file  Social History Narrative   Occasionally drinks soda   Social Determinants of Health    Financial Resource Strain:   . Difficulty of Paying Living Expenses: Not on file  Food Insecurity:   . Worried About Charity fundraiser in the Last Year: Not on file  . Ran Out of Food in the Last Year: Not on file  Transportation Needs:   . Lack of Transportation (Medical): Not on file  . Lack of Transportation (Non-Medical): Not on file  Physical Activity:   . Days of Exercise per Week: Not on file  . Minutes of Exercise per Session: Not on file  Stress:   . Feeling of Stress : Not on file  Social Connections:   . Frequency of Communication with Friends and Family: Not on file  . Frequency of Social Gatherings with Friends and Family: Not on file  . Attends Religious Services: Not on file  . Active Member of Clubs or Organizations: Not on file  . Attends Archivist Meetings: Not on file  . Marital Status: Not on file  Intimate Partner Violence:   . Fear of Current or Ex-Partner: Not on file  . Emotionally Abused: Not on file  . Physically Abused: Not on file  . Sexually Abused: Not on file    From [place], has  also lived in [place] a school bus Likes to do yardwork, no other exposures.   Allergies  Allergen Reactions  . Penicillins Swelling  . Sulfa Antibiotics Other (See Comments)    knots     Outpatient Medications Prior to Visit  Medication Sig Dispense Refill  . Ascorbic Acid (VITAMIN C PO) Take by mouth. Take one tablet daily    . ASPIRIN 81 PO Take by mouth. Take one tablet daily    . hydrOXYzine (ATARAX/VISTARIL) 25 MG tablet Take 1 tablet (25 mg total) by mouth 3 (three) times daily as needed. 30 tablet 0  . levothyroxine (SYNTHROID) 25 MCG tablet TAKE 1 TABLET (25 MCG TOTAL) BY MOUTH DAILY BEFORE BREAKFAST. 90 tablet 1  . olmesartan (BENICAR) 20 MG tablet TAKE 1 TABLET BY MOUTH EVERY DAY 90 tablet 1  . simvastatin (ZOCOR) 20 MG tablet TAKE 1 TABLET BY MOUTH EVERYDAY AT BEDTIME 90 tablet 0  . VITAMIN D PO Take by mouth. Take 1 tablet daily     No  facility-administered medications prior to visit.        Objective:   Physical Exam Vitals:   04/13/20 1030  BP: 124/72  Pulse: 72  Temp: 98.3 F (36.8 C)  TempSrc: Oral  SpO2: 100%  Weight: 165 lb (74.8 kg)  Height: 5\' 1"  (1.549 m)   Gen: Pleasant, overwt woman, in no distress,  normal affect  ENT: No lesions,  mouth clear,  oropharynx clear, no postnasal drip  Neck: No JVD, no stridor  Lungs: No use of accessory muscles, no crackles or wheezing on normal respiration, no wheeze on forced expiration  Cardiovascular: RRR, heart sounds normal, no murmur, ? +S4  Musculoskeletal: No deformities, no cyanosis or clubbing  Neuro: alert, awake, non focal  Skin: Warm, no lesions or rash      Assessment & Plan:  Chest tightness Chest tightness and some exertional shortness of breath that has been more noticeable since she had COVID-19 pneumonia in January 2021.  She has had asthma or an asthma-like syndrome in the past, question whether this is now reemerging.  She is able to exert but notices that her functional capacity is of the same, still has some dyspnea.  She has albuterol but has not tried it.  I think she needs a chest x-ray in the aftermath of her COVID-19 to make sure there is no residual pneumonitis, scarring.  If any interstitial changes on that film then we would perform a CT chest.  She also needs pulmonary function testing to quantify any degree of restriction or obstruction.  If we do identify obstruction then we can talk about an appropriate bronchodilator strategy.  Baltazar Apo, MD, PhD 04/13/2020, 10:56 AM [facility] [phone] or if no answer [phone]

## 2020-04-13 NOTE — Patient Instructions (Signed)
We will perform a chest x-ray today Okay to keep albuterol available to use 2 puffs if needed for shortness of breath, chest tightness Follow with Dr. Lamonte Sakai next available with full pulmonary function testing on the same day.

## 2020-04-13 NOTE — Assessment & Plan Note (Signed)
Chest tightness and some exertional shortness of breath that has been more noticeable since she had COVID-19 pneumonia in January 2021.  She has had asthma or an asthma-like syndrome in the past, question whether this is now reemerging.  She is able to exert but notices that her functional capacity is of the same, still has some dyspnea.  She has albuterol but has not tried it.  I think she needs a chest x-ray in the aftermath of her COVID-19 to make sure there is no residual pneumonitis, scarring.  If any interstitial changes on that film then we would perform a CT chest.  She also needs pulmonary function testing to quantify any degree of restriction or obstruction.  If we do identify obstruction then we can talk about an appropriate bronchodilator strategy.

## 2020-05-01 ENCOUNTER — Ambulatory Visit (INDEPENDENT_AMBULATORY_CARE_PROVIDER_SITE_OTHER): Payer: BC Managed Care – PPO

## 2020-05-01 ENCOUNTER — Ambulatory Visit
Admission: EM | Admit: 2020-05-01 | Discharge: 2020-05-01 | Disposition: A | Discharge status: Home / Self Care | Payer: BC Managed Care – PPO | Attending: Emergency Medicine | Admitting: Emergency Medicine

## 2020-05-01 ENCOUNTER — Other Ambulatory Visit: Payer: Self-pay

## 2020-05-01 ENCOUNTER — Encounter: Payer: Self-pay | Admitting: *Deleted

## 2020-05-01 DIAGNOSIS — R067 Sneezing: Secondary | ICD-10-CM

## 2020-05-01 DIAGNOSIS — R059 Cough, unspecified: Secondary | ICD-10-CM

## 2020-05-01 DIAGNOSIS — B349 Viral infection, unspecified: Secondary | ICD-10-CM | POA: Diagnosis not present

## 2020-05-01 DIAGNOSIS — R5383 Other fatigue: Secondary | ICD-10-CM | POA: Diagnosis not present

## 2020-05-01 DIAGNOSIS — R531 Weakness: Secondary | ICD-10-CM | POA: Diagnosis not present

## 2020-05-01 DIAGNOSIS — R5381 Other malaise: Secondary | ICD-10-CM | POA: Diagnosis not present

## 2020-05-01 NOTE — ED Provider Notes (Signed)
[name]    CSN: [id number] Arrival date & time: 05/01/20  5248      History   Chief Complaint Chief Complaint  Patient presents with  . Weakness  . Cough  . sneezing  . Nasal Congestion    HPI Tamara West is a 64 y.o. female  Presenting for URI symptoms with cough.  Patient provides history: Endorsing 2-week course of cough, dyspnea, weakness and sneezing.  Overall feels symptoms have improved.  Has not had breathing difficulties for the last few days.  Did have potential Covid exposure while waiting in a medical facility waiting room a few weeks ago.  Denying chest pain, difficulty breathing, fevers.  Not currently taking medications.  Requesting Covid testing.  Past Medical History:  Diagnosis Date  . Anxiety   . Depression   . Hyperlipidemia   . Hypertension   . Thyroid disease     Patient Active Problem List   Diagnosis Date Noted  . Chest tightness 04/13/2020  . Essential (primary) hypertension 08/06/2018  . Non-seasonal allergic rhinitis 08/06/2018  . Hypothyroidism 08/06/2018    Past Surgical History:  Procedure Laterality Date  . ABDOMINAL HYSTERECTOMY    . COLONOSCOPY  2004   w/Dr.Mann=hemorrhoid     OB History   No obstetric history on file.      Home Medications    Prior to Admission medications   Medication Sig Start Date End Date Taking? Authorizing Provider  Ascorbic Acid (VITAMIN C PO) Take by mouth. Take one tablet daily   Yes [provider]  ASPIRIN 81 PO Take by mouth. Take one tablet daily   Yes [provider]  levothyroxine (SYNTHROID) 25 MCG tablet TAKE 1 TABLET (25 MCG TOTAL) BY MOUTH DAILY BEFORE BREAKFAST. 02/16/20  Yes Minette Brine, FNP  olmesartan (BENICAR) 20 MG tablet TAKE 1 TABLET BY MOUTH EVERY DAY 09/23/19  Yes Minette Brine, FNP  simvastatin (ZOCOR) 20 MG tablet TAKE 1 TABLET BY MOUTH EVERYDAY AT BEDTIME 01/31/20  Yes Minette Brine, FNP  VITAMIN D PO Take by mouth. Take 1 tablet daily    Yes [provider]  hydrOXYzine (ATARAX/VISTARIL) 25 MG tablet Take 1 tablet (25 mg total) by mouth 3 (three) times daily as needed. 02/03/20   Minette Brine, FNP    Family History Family History  Problem Relation Age of Onset  . Stroke Father   . Hypertension Sister   . Hypertension Brother   . Colon cancer Neg Hx   . Breast cancer Neg Hx     Social History Social History   Tobacco Use  . Smoking status: Former Smoker    Packs/day: 0.50    Years: 3.00    Pack years: 1.50    Quit date: 1989    Years since quitting: 32.8  . Smokeless tobacco: Never Used  . Tobacco comment: Quit 1989  Substance Use Topics  . Alcohol use: No    Alcohol/week: 0.0 standard drinks  . Drug use: No     Allergies   Penicillins and Sulfa antibiotics   Review of Systems As per HPI   Physical Exam Triage Vital Signs ED Triage Vitals  Enc Vitals Group     BP      Pulse      Resp      Temp      Temp src      SpO2      Weight      Height      Head  Circumference      Peak Flow      Pain Score      Pain Loc      Pain Edu?      Excl. in [place]?    No data found.  Updated Vital Signs BP (!) 170/95   Pulse 76   Temp 98.2 F (36.8 C)   Resp 20   SpO2 98%   Visual Acuity Right Eye Distance:   Left Eye Distance:   Bilateral Distance:    Right Eye Near:   Left Eye Near:    Bilateral Near:     Physical Exam Constitutional:      General: She is not in acute distress.    Appearance: She is not ill-appearing or diaphoretic.  HENT:     Head: Normocephalic and atraumatic.     Mouth/Throat:     Mouth: Mucous membranes are moist.     Pharynx: Oropharynx is clear. No oropharyngeal exudate or posterior oropharyngeal erythema.  Eyes:     General: No scleral icterus.    Conjunctiva/sclera: Conjunctivae normal.     Pupils: Pupils are equal, round, and reactive to light.  Neck:     Comments: Trachea midline, negative JVD Cardiovascular:     Rate and Rhythm: Normal rate  and regular rhythm.     Heart sounds: No murmur heard.  No gallop.   Pulmonary:     Effort: Pulmonary effort is normal. No respiratory distress.     Breath sounds: No wheezing, rhonchi or rales.  Musculoskeletal:     Cervical back: Neck supple. No tenderness.  Lymphadenopathy:     Cervical: No cervical adenopathy.  Skin:    Capillary Refill: Capillary refill takes less than 2 seconds.     Coloration: Skin is not jaundiced or pale.     Findings: No rash.  Neurological:     General: No focal deficit present.     Mental Status: She is alert and oriented to person, place, and time.      UC Treatments / Results  Labs (all labs ordered are listed, but only abnormal results are displayed) Labs Reviewed  NOVEL CORONAVIRUS, NAA    EKG   Radiology DG Chest 2 View  Result Date: 05/01/2020 CLINICAL DATA:  Cough sneezing and weakness for 3 weeks EXAM: CHEST - 2 VIEW COMPARISON:  04/13/2020 FINDINGS: Cardiomediastinal contours and hilar structures are normal. Lungs are clear. No consolidation or pleural effusion. On limited assessment skeletal structures are unremarkable. IMPRESSION: No acute cardiopulmonary disease. Electronically Signed   By: Zetta Bills M.D.   On: 05/01/2020 09:42    Procedures Procedures (including critical care time)  Medications Ordered in UC Medications - No data to display  Initial Impression / Assessment and Plan / UC Course  I have reviewed the triage vital signs and the nursing notes.  Pertinent labs & imaging results that were available during my care of the patient were reviewed by me and considered in my medical decision making (see chart for details).     Patient afebrile, nontoxic, with SpO2 98%.  Cardiopulmonary exam reassuring at this time.  CXR done to r/o underlying PNA given h/o & duration of cough: negative.  Low concern for acute process.  Covid PCR pending.  Patient to quarantine until results are back.  We will treat supportively as  outlined below.  Return precautions discussed, patient verbalized understanding and is agreeable to plan. Final Clinical Impressions(s) / UC Diagnoses   Final diagnoses:  Viral illness  Malaise and fatigue     Discharge Instructions     Your COVID test is pending - it is important to quarantine / isolate at home until your results are back. If you test positive and would like further evaluation for persistent or worsening symptoms, you may schedule an E-visit or virtual (video) visit throughout the [facility] app or website.  PLEASE NOTE: If you develop severe chest pain or shortness of breath please go to the ER or call 9-1-1 for further evaluation --> DO NOT schedule electronic or virtual visits for this. Please call our office for further guidance / recommendations as needed.  For information about the Covid vaccine, please visit [email or web address]    ED Prescriptions    None     PDMP not reviewed this encounter.   [place], [place], [place] 05/01/20 1040

## 2020-05-01 NOTE — ED Triage Notes (Signed)
Patient in with complaints of sneezing,coughing and weakness. Patient states cough is productive and has a lot of sputum but did not notice a color. Patient states was dizzy x 4 days a week ago but it has subsided. Chills off and on. Weakness x 2 weeks. Patient has complaints of chest soreness from coughing. Headache a week ago but that has subsided at this time.Patient denies fever. Patient states she had COVID in January. Patient has received COVID vaccination.

## 2020-05-01 NOTE — Discharge Instructions (Addendum)
Your COVID test is pending - it is important to quarantine / isolate at home until your results are back. °If you test positive and would like further evaluation for persistent or worsening symptoms, you may schedule an E-visit or virtual (video) visit throughout the [facility] MyChart app or website. ° °PLEASE NOTE: If you develop severe chest pain or shortness of breath please go to the ER or call 9-1-1 for further evaluation --> DO NOT schedule electronic or virtual visits for this. °Please call our office for further guidance / recommendations as needed. ° °For information about the Covid vaccine, please visit [email or web address] °

## 2020-05-03 LAB — NOVEL CORONAVIRUS, NAA: SARS-CoV-2, NAA: NOT DETECTED

## 2020-05-03 LAB — SARS-COV-2, NAA 2 DAY TAT

## 2020-05-10 ENCOUNTER — Other Ambulatory Visit: Payer: Self-pay | Admitting: Nurse Practitioner

## 2020-05-11 ENCOUNTER — Other Ambulatory Visit: Payer: Self-pay | Admitting: Nurse Practitioner

## 2020-05-18 ENCOUNTER — Ambulatory Visit: Payer: BC Managed Care – PPO | Admitting: Emergency Medicine

## 2020-06-05 ENCOUNTER — Ambulatory Visit: Payer: BC Managed Care – PPO | Admitting: Nurse Practitioner

## 2020-07-05 ENCOUNTER — Ambulatory Visit (INDEPENDENT_AMBULATORY_CARE_PROVIDER_SITE_OTHER): Payer: Self-pay | Admitting: Emergency Medicine

## 2020-07-05 ENCOUNTER — Ambulatory Visit (INDEPENDENT_AMBULATORY_CARE_PROVIDER_SITE_OTHER): Payer: Medicare PPO | Admitting: Emergency Medicine

## 2020-07-05 ENCOUNTER — Encounter: Payer: Self-pay | Admitting: Emergency Medicine

## 2020-07-05 ENCOUNTER — Other Ambulatory Visit: Payer: Self-pay

## 2020-07-05 DIAGNOSIS — J3089 Other allergic rhinitis: Secondary | ICD-10-CM

## 2020-07-05 DIAGNOSIS — R0600 Dyspnea, unspecified: Secondary | ICD-10-CM

## 2020-07-05 DIAGNOSIS — R0789 Other chest pain: Secondary | ICD-10-CM

## 2020-07-05 LAB — PULMONARY FUNCTION TEST
DL/VA % pred: 116 %
DL/VA: 5.27 ml/min/mmHg/L
DLCO cor % pred: 102 %
DLCO cor: 22.1 ml/min/mmHg
DLCO unc % pred: 102 %
DLCO unc: 22.1 ml/min/mmHg
FEF 25-75 Post: 2 L/sec
FEF 25-75 Pre: 2.04 L/sec
FEF2575-%Change-Post: -1 %
FEF2575-%Pred-Post: 114 %
FEF2575-%Pred-Pre: 116 %
FEV1-%Change-Post: 0 %
FEV1-%Pred-Post: 101 %
FEV1-%Pred-Pre: 102 %
FEV1-Post: 1.92 L
FEV1-Pre: 1.94 L
FEV1FVC-%Change-Post: 1 %
FEV1FVC-%Pred-Pre: 103 %
FEV6-%Change-Post: -1 %
FEV6-%Pred-Post: 104 %
FEV6-%Pred-Pre: 106 %
FEV6-Post: 2.31 L
FEV6-Pre: 2.35 L
FEV6FVC-%Change-Post: 0 %
FEV6FVC-%Pred-Post: 108 %
FEV6FVC-%Pred-Pre: 108 %
FVC-%Change-Post: -2 %
FVC-%Pred-Post: 96 %
FVC-%Pred-Pre: 98 %
FVC-Post: 2.31 L
FVC-Pre: 2.37 L
Post FEV1/FVC ratio: 83 %
Post FEV6/FVC ratio: 100 %
Pre FEV1/FVC ratio: 82 %
Pre FEV6/FVC Ratio: 99 %
RV % pred: 106 %
RV: 2.12 L
TLC % pred: 98 %
TLC: 4.66 L

## 2020-07-05 NOTE — Progress Notes (Signed)
PFT completed today.  

## 2020-07-05 NOTE — Progress Notes (Signed)
Subjective:    Patient ID: Tamara West, female    DOB: Apr 05, 1956, 64 y.o.   MRN: [id number]  HPI 64 year old woman with a minimal tobacco history (1.5 pack years), hyperlipidemia, hypertension, hypothyroidism, asthma as a young adult - seemed to grow out it.  She was diagnosed with COVID-19 pneumonitis in January 2021.  She received monoclonal antibody infusion and was managed as an outpatient. She had aches, HA, cough and mucous, lost smell. She has improved, still has some residual brain fog, resp sx as below. No CXR done when she had COVID.  She is here today for evaluation of residual chest tightness, some exertional SOB that she can work through, able to continue to exert but more difficult. She has an albuterol, but has not tried it. No real cough, has a lot of sneezing, clear nasal gtt. Some sinus HA lately.   ROV 07/05/20 --this is a follow-up visit for 64 year old woman with history of childhood asthma.  She was diagnosed with COVID-19 pneumonitis in January 2021.  Experiencing some residual chest tightness, exertional shortness of breath since that time, question some reemerging asthma symptoms in the aftermath of her viral infection.  She is doing quite well, is better than last visit. She doesn't have any SOB. She does cough some, has some mucous that she needs to clear at night. She snores some, well rested in daytime, no napping.   Pulmonary function testing done today reviewed by me, shows normal airflows without a bronchodilator response, normal lung volumes and a normal diffusion capacity.  Chest x-ray performed on 04/13/2020 without any evidence of infiltrates, consolidation. Chest x-ray performed 05/01/2020 reviewed by me, clear   Review of Systems As per HPI  Past Medical History:  Diagnosis Date  . Anxiety   . Depression   . Hyperlipidemia   . Hypertension   . Thyroid disease      Family History  Problem Relation Age of Onset  . Stroke Father   . Hypertension  Sister   . Hypertension Brother   . Colon cancer Neg Hx   . Breast cancer Neg Hx      Social History   Socioeconomic History  . Marital status: Widowed    Spouse name: Not on file  . Number of children: 4  . Years of education: 10  . Highest education level: Not on file  Occupational History  . Occupation: International aid/development worker: [employer]  Tobacco Use  . Smoking status: Former Smoker    Packs/day: 0.50    Years: 3.00    Pack years: 1.50    Quit date: 1989    Years since quitting: 32.9  . Smokeless tobacco: Never Used  . Tobacco comment: Quit 1989  Substance and Sexual Activity  . Alcohol use: No    Alcohol/week: 0.0 standard drinks  . Drug use: No  . Sexual activity: Not Currently  Other Topics Concern  . Not on file  Social History Narrative   Occasionally drinks soda   Social Determinants of Health   Financial Resource Strain: Not on file  Food Insecurity: Not on file  Transportation Needs: Not on file  Physical Activity: Not on file  Stress: Not on file  Social Connections: Not on file  Intimate Partner Violence: Not on file    From [place], has also lived in [place] a school bus Likes to do yardwork, no other exposures.   Allergies  Allergen Reactions  . Penicillins Swelling  .  Sulfa Antibiotics Other (See Comments)    knots     Outpatient Medications Prior to Visit  Medication Sig Dispense Refill  . Ascorbic Acid (VITAMIN C PO) Take by mouth. Take one tablet daily    . ASPIRIN 81 PO Take by mouth. Take one tablet daily    . levothyroxine (SYNTHROID) 25 MCG tablet TAKE 1 TABLET (25 MCG TOTAL) BY MOUTH DAILY BEFORE BREAKFAST. 90 tablet 1  . olmesartan (BENICAR) 20 MG tablet TAKE 1 TABLET BY MOUTH EVERY DAY 90 tablet 1  . simvastatin (ZOCOR) 20 MG tablet TAKE 1 TABLET BY MOUTH EVERYDAY AT BEDTIME 90 tablet 0  . VITAMIN D PO Take by mouth. Take 1 tablet daily    . hydrOXYzine (ATARAX/VISTARIL) 25 MG tablet Take 1 tablet (25 mg total) by mouth 3  (three) times daily as needed. (Patient not taking: Reported on 07/05/2020) 30 tablet 0   No facility-administered medications prior to visit.        Objective:   Physical Exam Vitals:   07/05/20 1143  BP: 140/90  Pulse: 75  SpO2: 97%  Weight: 167 lb 6.4 oz (75.9 kg)  Height: 5\' 2"  (1.575 m)   Gen: Pleasant, overwt woman, in no distress,  normal affect  ENT: No lesions,  mouth clear,  oropharynx clear, no postnasal drip  Neck: No JVD, no stridor  Lungs: No use of accessory muscles, no crackles or wheezing on normal respiration, no wheeze on forced expiration  Cardiovascular: RRR, heart sounds normal, no murmur, ? +S4  Musculoskeletal: No deformities, no cyanosis or clubbing  Neuro: alert, awake, non focal  Skin: Warm, no lesions or rash      Assessment & Plan:  Chest tightness Improved. She has not needed or used albuterol. Her pulmonary function testing and chest x-ray are both reassuring. She has an albuterol inhaler but does not use it. I do not think she will need to do so unless is a particular flaring trigger such as URI. We do not need scheduled bronchodilator therapy. Plan to follow her as needed  Your chest x-ray and your pulmonary function testing both look normal following your COVID-19 infection. This is great news. Okay to keep your albuterol available to use 2 puffs if you need it for shortness of breath. You do not need to take an inhaler on any particular schedule. Please call our office to be seen if you develop any changes in your breathing or any other respiratory symptoms  Non-seasonal allergic rhinitis Marginal control when she does have cough at night. Discussed with her possibly starting an everyday antihistamine. She not on any controller medication right now.  Baltazar Apo, MD, PhD 07/05/2020, 12:04 PM [facility] [phone] or if no answer [phone]

## 2020-07-05 NOTE — Assessment & Plan Note (Signed)
Marginal control when she does have cough at night. Discussed with her possibly starting an everyday antihistamine. She not on any controller medication right now.

## 2020-07-05 NOTE — Patient Instructions (Signed)
Your chest x-ray and your pulmonary function testing both look normal following your COVID-19 infection. This is great news. Okay to keep your albuterol available to use 2 puffs if you need it for shortness of breath. You do not need to take an inhaler on any particular schedule. Please call our office to be seen if you develop any changes in your breathing or any other respiratory symptoms.

## 2020-07-05 NOTE — Assessment & Plan Note (Signed)
Improved. She has not needed or used albuterol. Her pulmonary function testing and chest x-ray are both reassuring. She has an albuterol inhaler but does not use it. I do not think she will need to do so unless is a particular flaring trigger such as URI. We do not need scheduled bronchodilator therapy. Plan to follow her as needed  Your chest x-ray and your pulmonary function testing both look normal following your COVID-19 infection. This is great news. Okay to keep your albuterol available to use 2 puffs if you need it for shortness of breath. You do not need to take an inhaler on any particular schedule. Please call our office to be seen if you develop any changes in your breathing or any other respiratory symptoms

## 2020-07-10 ENCOUNTER — Other Ambulatory Visit: Payer: Self-pay

## 2020-07-10 ENCOUNTER — Encounter: Payer: Self-pay | Admitting: Nurse Practitioner

## 2020-07-10 ENCOUNTER — Ambulatory Visit (INDEPENDENT_AMBULATORY_CARE_PROVIDER_SITE_OTHER): Payer: Medicare PPO | Admitting: Nurse Practitioner

## 2020-07-10 VITALS — BP 122/84 | HR 76 | Temp 98.2°F | Ht 62.0 in | Wt 167.8 lb

## 2020-07-10 DIAGNOSIS — E039 Hypothyroidism, unspecified: Secondary | ICD-10-CM

## 2020-07-10 DIAGNOSIS — E2839 Other primary ovarian failure: Secondary | ICD-10-CM

## 2020-07-10 DIAGNOSIS — E7800 Pure hypercholesterolemia, unspecified: Secondary | ICD-10-CM | POA: Diagnosis not present

## 2020-07-10 DIAGNOSIS — I10 Essential (primary) hypertension: Secondary | ICD-10-CM | POA: Diagnosis not present

## 2020-07-10 DIAGNOSIS — Z1211 Encounter for screening for malignant neoplasm of colon: Secondary | ICD-10-CM | POA: Diagnosis not present

## 2020-07-10 DIAGNOSIS — Z86010 Personal history of colonic polyps: Secondary | ICD-10-CM

## 2020-07-10 MED ORDER — SIMVASTATIN 20 MG PO TABS: 20.0000 mg | ORAL_TABLET | Freq: Every day | ORAL | 1 refills | Status: DC

## 2020-07-10 NOTE — Progress Notes (Signed)
I,Yamilka Roman [employer] as a Education administrator for [employer], FNP.,have documented all relevant documentation on the behalf of Minette Brine, FNP,as directed by  Minette Brine, FNP while in the presence of Minette Brine, [place]. This visit occurred during the SARS-CoV-2 public health emergency.  Safety protocols were in place, including screening questions prior to the visit, additional usage of staff PPE, and extensive cleaning of exam room while observing appropriate contact time as indicated for disinfecting solutions.  Subjective:     Patient ID: Tamara West , female    DOB: 12/05/1955 , 64 y.o.   MRN: [id number]   Chief Complaint  Patient presents with  . Hypertension    HPI  Patient is here for a f/u on her blood pressure. She has been using lemon pepper seasoning. She had her covid booster yesterday today she is tired.    Wt Readings from Last 3 Encounters: 07/10/20 : 167 lb 12.8 oz (76.1 kg) 07/05/20 : 167 lb 6.4 oz (75.9 kg) 04/13/20 : 165 lb (74.8 kg)  Hypertension This is a chronic problem. The current episode started more than 1 year ago. The problem is unchanged (slightly elevated today reports was 140/80. she has not taken her blood pressure medications yet). The problem is controlled. Pertinent negatives include no anxiety, chest pain, headaches or palpitations. There are no associated agents to hypertension. Risk factors for coronary artery disease include sedentary lifestyle and obesity. Past treatments include angiotensin blockers. The current treatment provides significant improvement. There are no compliance problems.  There is no history of angina. Identifiable causes of hypertension include a thyroid problem. There is no history of chronic renal disease.  Thyroid Problem Presents for follow-up visit. Patient reports no anxiety, fatigue, palpitations or weight gain. The symptoms have been stable.     Past Medical History:  Diagnosis Date  . Anxiety   . Depression   .  Hyperlipidemia   . Hypertension   . Thyroid disease      Family History  Problem Relation Age of Onset  . Stroke Father   . Hypertension Sister   . Hypertension Brother   . Colon cancer Neg Hx   . Breast cancer Neg Hx      Current Outpatient Medications:  .  ASPIRIN 81 PO, Take by mouth. Take one tablet daily, Disp: , Rfl:  .  levothyroxine (SYNTHROID) 25 MCG tablet, TAKE 1 TABLET (25 MCG TOTAL) BY MOUTH DAILY BEFORE BREAKFAST., Disp: 90 tablet, Rfl: 1 .  olmesartan (BENICAR) 20 MG tablet, TAKE 1 TABLET BY MOUTH EVERY DAY, Disp: 90 tablet, Rfl: 1 .  VITAMIN D PO, Take by mouth. Take 1 tablet daily, Disp: , Rfl:  .  Ascorbic Acid (VITAMIN C PO), Take by mouth. Take one tablet daily (Patient not taking: Reported on 07/10/2020), Disp: , Rfl:  .  simvastatin (ZOCOR) 20 MG tablet, Take 1 tablet (20 mg total) by mouth daily at 6 PM., Disp: 90 tablet, Rfl: 1   Allergies  Allergen Reactions  . Penicillins Swelling  . Sulfa Antibiotics Other (See Comments)    knots     Review of Systems  Constitutional: Negative.  Negative for fatigue and weight gain.  HENT: Negative.   Eyes: Negative.   Respiratory: Negative.   Cardiovascular: Negative.  Negative for chest pain and palpitations.  Gastrointestinal: Negative.   Endocrine: Negative.   Genitourinary: Negative.   Musculoskeletal: Negative.   Skin: Negative.   Neurological: Negative.  Negative for headaches.  Hematological: Negative.   Psychiatric/Behavioral:  Negative.  The patient is not nervous/anxious.      Today's Vitals   07/10/20 1147  BP: 122/84  Pulse: 76  Temp: 98.2 F (36.8 C)  TempSrc: Oral  Weight: 167 lb 12.8 oz (76.1 kg)  Height: 5' 2" (1.575 m)  PainSc: 0-No pain   Body mass index is 30.69 kg/m.   Objective:  Physical Exam Vitals reviewed.  Constitutional:      General: She is not in acute distress.    Appearance: Normal appearance. She is well-developed. She is obese.  HENT:     Head: Normocephalic  and atraumatic.  Eyes:     Pupils: Pupils are equal, round, and reactive to light.  Cardiovascular:     Rate and Rhythm: Normal rate and regular rhythm.     Pulses: Normal pulses.     Heart sounds: Normal heart sounds. No murmur heard.   Pulmonary:     Effort: Pulmonary effort is normal. No respiratory distress.     Breath sounds: Normal breath sounds. No wheezing.  Musculoskeletal:        General: Normal range of motion.  Skin:    General: Skin is warm and dry.     Capillary Refill: Capillary refill takes less than 2 seconds.  Neurological:     General: No focal deficit present.     Mental Status: She is alert and oriented to person, place, and time.     Cranial Nerves: No cranial nerve deficit.     Motor: No weakness.  Psychiatric:        Mood and Affect: Mood normal.        Behavior: Behavior normal.        Thought Content: Thought content normal.        Judgment: Judgment normal.         Assessment And Plan:     1. Essential (primary) hypertension  Chronic, well controlled  Continue with current medications - CMP14+EGFR  2. Acquired hypothyroidism  Chronic, controlled  Continue with current medications, will check levels today - CMP14+EGFR - TSH - T4 - T3, free  3. Elevated cholesterol  Stable, continue with statin and tolerating well - simvastatin (ZOCOR) 20 MG tablet; Take 1 tablet (20 mg total) by mouth daily at 6 PM.  Dispense: 90 tablet; Refill: 1 - Lipid panel  4. Encounter for screening colonoscopy  According to USPTF Colorectal cancer Screening guidelines. Colonoscopy is recommended every 10 years, starting at age 50years.  Will refer to GI for colon cancer screening. - Ambulatory referral to Gastroenterology  5. History of colon polyps  Referral to GI for re-evaluation  6. Decreased estrogen level  Discussed importance of good bone health - DG Bone Density; Future    Patient was given opportunity to ask questions. Patient  verbalized understanding of the plan and was able to repeat key elements of the plan. All questions were answered to their satisfaction.  Janece Moore, FNP    I, Janece Moore, FNP, have reviewed all documentation for this visit. The documentation on 07/10/20 for the exam, diagnosis, procedures, and orders are all accurate and complete.   THE PATIENT IS ENCOURAGED TO PRACTICE SOCIAL DISTANCING DUE TO THE COVID-19 PANDEMIC.  

## 2020-07-10 NOTE — Patient Instructions (Signed)

## 2020-07-11 LAB — CMP14+EGFR
ALT: 16 IU/L (ref 0–32)
AST: 22 IU/L (ref 0–40)
Albumin/Globulin Ratio: 1.5 (ref 1.2–2.2)
Albumin: 4.4 g/dL (ref 3.8–4.8)
Alkaline Phosphatase: 101 IU/L (ref 44–121)
BUN/Creatinine Ratio: 15 (ref 12–28)
BUN: 13 mg/dL (ref 8–27)
Bilirubin Total: <0.2 mg/dL (ref 0.0–1.2)
CO2: 24 mmol/L (ref 20–29)
Calcium: 10 mg/dL (ref 8.7–10.3)
Chloride: 101 mmol/L (ref 96–106)
Creatinine, Ser: 0.87 mg/dL (ref 0.57–1.00)
GFR calc Af Amer: 81 mL/min/{1.73_m2} (ref >59)
GFR calc non Af Amer: 70 mL/min/{1.73_m2} (ref >59)
Globulin, Total: 3 g/dL (ref 1.5–4.5)
Glucose: 74 mg/dL (ref 65–99)
Potassium: 4.6 mmol/L (ref 3.5–5.2)
Sodium: 140 mmol/L (ref 134–144)
Total Protein: 7.4 g/dL (ref 6.0–8.5)

## 2020-07-11 LAB — LIPID PANEL
Chol/HDL Ratio: 3.6 ratio (ref 0.0–4.4)
Cholesterol, Total: 199 mg/dL (ref 100–199)
HDL: 56 mg/dL (ref >39)
LDL Chol Calc (NIH): 127 mg/dL — ABNORMAL HIGH (ref 0–99)
Triglycerides: 87 mg/dL (ref 0–149)
VLDL Cholesterol Cal: 16 mg/dL (ref 5–40)

## 2020-07-11 LAB — T4: T4, Total: 7.1 ug/dL (ref 4.5–12.0)

## 2020-07-11 LAB — TSH: TSH: 3.31 u[IU]/mL (ref 0.450–4.500)

## 2020-07-11 LAB — T3, FREE: T3, Free: 2.3 pg/mL (ref 2.0–4.4)

## 2020-08-23 ENCOUNTER — Encounter: Payer: Self-pay | Admitting: Gastroenterology

## 2020-09-19 ENCOUNTER — Ambulatory Visit (AMBULATORY_SURGERY_CENTER): Payer: Medicare PPO

## 2020-09-19 ENCOUNTER — Other Ambulatory Visit: Payer: Self-pay

## 2020-09-19 ENCOUNTER — Encounter: Payer: BC Managed Care – PPO | Admitting: Nurse Practitioner

## 2020-09-19 VITALS — Ht 62.0 in | Wt 165.0 lb

## 2020-09-19 DIAGNOSIS — Z86010 Personal history of colonic polyps: Secondary | ICD-10-CM

## 2020-09-19 MED ORDER — NA SULFATE-K SULFATE-MG SULF 17.5-3.13-1.6 GM/177ML PO SOLN: 1.0000 | Freq: Once | ORAL | 0 refills | Status: AC

## 2020-09-19 NOTE — Progress Notes (Signed)
Patient's pre-visit was done today over the phone with the patient due to COVID-19 pandemic.   Name,DOB and address verified. Insurance verified. \  Patient denies any allergies to Eggs and Soy.  Patient denies any problems with anesthesia/sedation. Patient denies taking diet pills or blood thinners.  Patient denies constipation  Packet of Prep instructions mailed to patient including a copy of a consent form and pre-procedure patient acknowledgement form (with envelope to mail back to us)-pt is aware.    Patient understands to call us back with any questions or concerns. The patient is COVID-19 fully vaccinated, per patient. Patient is aware of our care-partner policy and XMIWO-03 safety protocol. EMMI education assigned to the patient for the procedure, sent to [facility].

## 2020-10-03 ENCOUNTER — Other Ambulatory Visit: Payer: Self-pay

## 2020-10-03 ENCOUNTER — Ambulatory Visit (AMBULATORY_SURGERY_CENTER): Payer: Medicare PPO | Admitting: Gastroenterology

## 2020-10-03 ENCOUNTER — Encounter: Payer: Self-pay | Admitting: Gastroenterology

## 2020-10-03 VITALS — BP 134/65 | HR 64 | Temp 97.1°F | Resp 10 | Ht 62.0 in | Wt 165.0 lb

## 2020-10-03 DIAGNOSIS — Z86010 Personal history of colonic polyps: Secondary | ICD-10-CM

## 2020-10-03 DIAGNOSIS — Z1211 Encounter for screening for malignant neoplasm of colon: Secondary | ICD-10-CM | POA: Diagnosis not present

## 2020-10-03 MED ORDER — SODIUM CHLORIDE 0.9 % IV SOLN: 500.0000 mL | Freq: Once | INTRAVENOUS | Status: DC

## 2020-10-03 NOTE — Patient Instructions (Signed)
No cancers or polyps seen on today's exam.   Repeat colonoscopy in 7 years for surveillance.   YOU HAD AN ENDOSCOPIC PROCEDURE TODAY AT [facility]:   Refer to the procedure report that was given to you for any specific questions about what was found during the examination.  If the procedure report does not answer your questions, please call your gastroenterologist to clarify.  If you requested that your care partner not be given the details of your procedure findings, then the procedure report has been included in a sealed envelope for you to review at your convenience later.  YOU SHOULD EXPECT: Some feelings of bloating in the abdomen. Passage of more gas than usual.  Walking can help get rid of the air that was put into your GI tract during the procedure and reduce the bloating. If you had a lower endoscopy (such as a colonoscopy or flexible sigmoidoscopy) you may notice spotting of blood in your stool or on the toilet paper. If you underwent a bowel prep for your procedure, you may not have a normal bowel movement for a few days.  Please Note:  You might notice some irritation and congestion in your nose or some drainage.  This is from the oxygen used during your procedure.  There is no need for concern and it should clear up in a day or so.  SYMPTOMS TO REPORT IMMEDIATELY:   Following lower endoscopy (colonoscopy or flexible sigmoidoscopy):  Excessive amounts of blood in the stool  Significant tenderness or worsening of abdominal pains  Swelling of the abdomen that is new, acute  Fever of 100F or higher  For urgent or emergent issues, a gastroenterologist can be reached at any hour by calling [phone]. Do not use MyChart messaging for urgent concerns.    DIET:  We do recommend a small meal at first, but then you may proceed to your regular diet.  Drink plenty of fluids but you should avoid alcoholic beverages for 24 hours.  ACTIVITY:  You should plan to take it  easy for the rest of today and you should NOT DRIVE or use heavy machinery until tomorrow (because of the sedation medicines used during the test).    FOLLOW UP: Our staff will call the number listed on your records 48-72 hours following your procedure to check on you and address any questions or concerns that you may have regarding the information given to you following your procedure. If we do not reach you, we will leave a message.  We will attempt to reach you two times.  During this call, we will ask if you have developed any symptoms of COVID 19. If you develop any symptoms (ie: fever, flu-like symptoms, shortness of breath, cough etc.) before then, please call [phone].  If you test positive for Covid 19 in the 2 weeks post procedure, please call and report this information to [place].    If any biopsies were taken you will be contacted by phone or by letter within the next 1-3 weeks.  Please call us at [phone] if you have not heard about the biopsies in 3 weeks.    SIGNATURES/CONFIDENTIALITY: You and/or your care partner have signed paperwork which will be entered into your electronic medical record.  These signatures attest to the fact that that the information above on your After Visit Summary has been reviewed and is understood.  Full responsibility of the confidentiality of this discharge information lies with you and/or your care-partner.

## 2020-10-03 NOTE — Op Note (Signed)
[facility] Patient Name: Tamara West Procedure Date: 10/03/2020 9:01 AM MRN: [id number] Endoscopist: Ladene Artist , MD Age: 65 Referring MD:  Date of Birth: 04-24-1956 Gender: Female Account #: [id number] Procedure:                Colonoscopy Indications:              Surveillance: Personal history of adenomatous                            polyps on last colonoscopy > 5 years ago Medicines:                Monitored Anesthesia Care Procedure:                Pre-Anesthesia Assessment:                           - Prior to the procedure, a History and Physical                            was performed, and patient medications and                            allergies were reviewed. The patient's tolerance of                            previous anesthesia was also reviewed. The risks                            and benefits of the procedure and the sedation                            options and risks were discussed with the patient.                            All questions were answered, and informed consent                            was obtained. Prior Anticoagulants: The patient has                            taken no previous anticoagulant or antiplatelet                            agents. ASA Grade Assessment: II - A patient with                            mild systemic disease. After reviewing the risks                            and benefits, the patient was deemed in                            satisfactory condition to undergo the procedure.  After obtaining informed consent, the colonoscope                            was passed under direct vision. Throughout the                            procedure, the patient's blood pressure, pulse, and                            oxygen saturations were monitored continuously. The                            Olympus PCF-H190DL (#[id number]) Colonoscope was                            introduced through the anus  and advanced to the the                            cecum, identified by appendiceal orifice and                            ileocecal valve. The ileocecal valve, appendiceal                            orifice, and rectum were photographed. The quality                            of the bowel preparation was excellent. The                            colonoscopy was performed without difficulty. The                            patient tolerated the procedure well. Scope In: 9:17:02 AM Scope Out: 9:30:42 AM Scope Withdrawal Time: 0 hours 8 minutes 36 seconds  Total Procedure Duration: 0 hours 13 minutes 40 seconds  Findings:                 The perianal and digital rectal examinations were                            normal.                           The entire examined colon appeared normal on direct                            and retroflexion views. Complications:            No immediate complications. Estimated blood loss:                            None. Estimated Blood Loss:     Estimated blood loss: none. Impression:               - The entire examined colon is normal on direct  and                            retroflexion views.                           - No specimens collected. Recommendation:           - Repeat colonoscopy in 7 years for surveillance.                           - Patient has a contact number available for                            emergencies. The signs and symptoms of potential                            delayed complications were discussed with the                            patient. Return to normal activities tomorrow.                            Written discharge instructions were provided to the                            patient.                           - Resume previous diet.                           - Continue present medications. Ladene Artist, MD 10/03/2020 9:33:16 AM This report has been signed electronically.

## 2020-10-03 NOTE — Progress Notes (Signed)
Vitals by [place]. Pt's states no medical or surgical changes since previsit or office visit.  

## 2020-10-03 NOTE — Progress Notes (Signed)
To PACU,VSs. Report to RN.tb

## 2020-10-05 ENCOUNTER — Telehealth: Payer: Self-pay | Admitting: *Deleted

## 2020-10-05 ENCOUNTER — Telehealth: Payer: Self-pay

## 2020-10-05 NOTE — Telephone Encounter (Signed)
  Follow up Call-  Call back number 10/03/2020  Post procedure Call Back phone  # [phone]  Permission to leave phone message Yes  Some recent data might be hidden     Patient questions:  Do you have a fever, pain , or abdominal swelling? No. Pain Score  0 *  Have you tolerated food without any problems? Yes.    Have you been able to return to your normal activities? Yes.    Do you have any questions about your discharge instructions: Diet   No. Medications  No. Follow up visit  No.  Do you have questions or concerns about your Care? No.  Actions: * If pain score is 4 or above: No action needed, pain <4.  1. Have you developed a fever since your procedure? no  2.   Have you had an respiratory symptoms (SOB or cough) since your procedure? no  3.   Have you tested positive for COVID 19 since your procedure no  4.   Have you had any family members/close contacts diagnosed with the COVID 19 since your procedure?  no   If yes to any of these questions please route to Joylene John, RN and Joella Prince, RN

## 2020-10-05 NOTE — Telephone Encounter (Signed)
No answer, left VM will try again this afternoon.

## 2020-10-09 ENCOUNTER — Ambulatory Visit: Payer: Medicare PPO | Admitting: Nurse Practitioner

## 2020-10-28 ENCOUNTER — Other Ambulatory Visit: Payer: Self-pay | Admitting: Nurse Practitioner

## 2020-10-28 DIAGNOSIS — E039 Hypothyroidism, unspecified: Secondary | ICD-10-CM

## 2020-11-03 ENCOUNTER — Other Ambulatory Visit: Payer: Self-pay | Admitting: Nurse Practitioner

## 2020-11-13 ENCOUNTER — Encounter: Payer: Medicare PPO | Admitting: Nurse Practitioner

## 2020-12-06 ENCOUNTER — Ambulatory Visit (INDEPENDENT_AMBULATORY_CARE_PROVIDER_SITE_OTHER): Payer: Medicare PPO | Admitting: Nurse Practitioner

## 2020-12-06 ENCOUNTER — Other Ambulatory Visit: Payer: Self-pay

## 2020-12-06 ENCOUNTER — Encounter: Payer: Self-pay | Admitting: Nurse Practitioner

## 2020-12-06 VITALS — BP 138/76 | HR 80 | Temp 98.4°F | Ht 59.0 in | Wt 162.0 lb

## 2020-12-06 DIAGNOSIS — E039 Hypothyroidism, unspecified: Secondary | ICD-10-CM | POA: Diagnosis not present

## 2020-12-06 DIAGNOSIS — I10 Essential (primary) hypertension: Secondary | ICD-10-CM | POA: Diagnosis not present

## 2020-12-06 DIAGNOSIS — Z0001 Encounter for general adult medical examination with abnormal findings: Secondary | ICD-10-CM | POA: Diagnosis not present

## 2020-12-06 DIAGNOSIS — Z8616 Personal history of COVID-19: Secondary | ICD-10-CM

## 2020-12-06 DIAGNOSIS — R82998 Other abnormal findings in urine: Secondary | ICD-10-CM

## 2020-12-06 DIAGNOSIS — H6121 Impacted cerumen, right ear: Secondary | ICD-10-CM | POA: Diagnosis not present

## 2020-12-06 DIAGNOSIS — Z0000 Encounter for general adult medical examination without abnormal findings: Secondary | ICD-10-CM

## 2020-12-06 DIAGNOSIS — E7800 Pure hypercholesterolemia, unspecified: Secondary | ICD-10-CM | POA: Diagnosis not present

## 2020-12-06 DIAGNOSIS — F419 Anxiety disorder, unspecified: Secondary | ICD-10-CM

## 2020-12-06 LAB — POCT URINALYSIS DIPSTICK
Bilirubin, UA: NEGATIVE
Blood, UA: NEGATIVE
Glucose, UA: NEGATIVE
Ketones, UA: NEGATIVE
Nitrite, UA: NEGATIVE
Protein, UA: NEGATIVE
Spec Grav, UA: 1.01 (ref 1.010–1.025)
Urobilinogen, UA: 0.2 E.U./dL
pH, UA: 5.5 (ref 5.0–8.0)

## 2020-12-06 LAB — POCT UA - MICROALBUMIN
Albumin/Creatinine Ratio, Urine, POC: 30
Creatinine, POC: 50 mg/dL
Microalbumin Ur, POC: 10 mg/L

## 2020-12-06 MED ORDER — PNEUMOCOCCAL 13-VAL CONJ VACC IM SUSP: 0.5000 mL | INTRAMUSCULAR | 0 refills | Status: AC

## 2020-12-06 NOTE — Patient Instructions (Addendum)
Health Maintenance, Female Adopting a healthy lifestyle and getting preventive care are important in promoting health and wellness. Ask your health care provider about:  The right schedule for you to have regular tests and exams.  Things you can do on your own to prevent diseases and keep yourself healthy. What should I know about diet, weight, and exercise? Eat a healthy diet  Eat a diet that includes plenty of vegetables, fruits, low-fat dairy products, and lean protein.  Do not eat a lot of foods that are high in solid fats, added sugars, or sodium.   Maintain a healthy weight Body mass index (BMI) is used to identify weight problems. It estimates body fat based on height and weight. Your health care provider can help determine your BMI and help you achieve or maintain a healthy weight. Get regular exercise Get regular exercise. This is one of the most important things you can do for your health. Most adults should:  Exercise for at least 150 minutes each week. The exercise should increase your heart rate and make you sweat (moderate-intensity exercise).  Do strengthening exercises at least twice a week. This is in addition to the moderate-intensity exercise.  Spend less time sitting. Even light physical activity can be beneficial. Watch cholesterol and blood lipids Have your blood tested for lipids and cholesterol at 66 years of age, then have this test every 5 years. Have your cholesterol levels checked more often if:  Your lipid or cholesterol levels are high.  You are older than 66 years of age.  You are at high risk for heart disease. What should I know about cancer screening? Depending on your health history and family history, you may need to have cancer screening at various ages. This may include screening for:  Breast cancer.  Cervical cancer.  Colorectal cancer.  Skin cancer.  Lung cancer. What should I know about heart disease, diabetes, and high blood  pressure? Blood pressure and heart disease  High blood pressure causes heart disease and increases the risk of stroke. This is more likely to develop in people who have high blood pressure readings, are of African descent, or are overweight.  Have your blood pressure checked: ? Every 3-5 years if you are 18-39 years of age. ? Every year if you are 40 years old or older. Diabetes Have regular diabetes screenings. This checks your fasting blood sugar level. Have the screening done:  Once every three years after age 40 if you are at a normal weight and have a low risk for diabetes.  More often and at a younger age if you are overweight or have a high risk for diabetes. What should I know about preventing infection? Hepatitis B If you have a higher risk for hepatitis B, you should be screened for this virus. Talk with your health care provider to find out if you are at risk for hepatitis B infection. Hepatitis C Testing is recommended for:  Everyone born from 1945 through 1965.  Anyone with known risk factors for hepatitis C. Sexually transmitted infections (STIs)  Get screened for STIs, including gonorrhea and chlamydia, if: ? You are sexually active and are younger than 66 years of age. ? You are older than 66 years of age and your health care provider tells you that you are at risk for this type of infection. ? Your sexual activity has changed since you were last screened, and you are at increased risk for chlamydia or gonorrhea. Ask your health care provider   if you are at risk.  Ask your health care provider about whether you are at high risk for HIV. Your health care provider may recommend a prescription medicine to help prevent HIV infection. If you choose to take medicine to prevent HIV, you should first get tested for HIV. You should then be tested every 3 months for as long as you are taking the medicine. Pregnancy  If you are about to stop having your period (premenopausal) and  you may become pregnant, seek counseling before you get pregnant.  Take 400 to 800 micrograms (mcg) of folic acid every day if you become pregnant.  Ask for birth control (contraception) if you want to prevent pregnancy. Osteoporosis and menopause Osteoporosis is a disease in which the bones lose minerals and strength with aging. This can result in bone fractures. If you are 1 years old or older, or if you are at risk for osteoporosis and fractures, ask your health care provider if you should:  Be screened for bone loss.  Take a calcium or vitamin D supplement to lower your risk of fractures.  Be given hormone replacement therapy (HRT) to treat symptoms of menopause. Follow these instructions at home: Lifestyle  Do not use any products that contain nicotine or tobacco, such as cigarettes, e-cigarettes, and chewing tobacco. If you need help quitting, ask your health care provider.  Do not use street drugs.  Do not share needles.  Ask your health care provider for help if you need support or information about quitting drugs. Alcohol use  Do not drink alcohol if: ? Your health care provider tells you not to drink. ? You are pregnant, may be pregnant, or are planning to become pregnant.  If you drink alcohol: ? Limit how much you use to 0-1 drink a day. ? Limit intake if you are breastfeeding.  Be aware of how much alcohol is in your drink. In the [place] one drink equals one 12 oz bottle of beer (355 mL), one 5 oz glass of wine (148 mL), or one 1 oz glass of hard liquor (44 mL). General instructions  Schedule regular health, dental, and eye exams.  Stay current with your vaccines.  Tell your health care provider if: ? You often feel depressed. ? You have ever been abused or do not feel safe at home. Summary  Adopting a healthy lifestyle and getting preventive care are important in promoting health and wellness.  Follow your health care provider's instructions about healthy  diet, exercising, and getting tested or screened for diseases.  Follow your health care provider's instructions on monitoring your cholesterol and blood pressure. This information is not intended to replace advice given to you by your health care provider. Make sure you discuss any questions you have with your health care provider. Document Revised: 06/24/2018 Document Reviewed: 06/24/2018 Elsevier Patient Education  2021 [place] to pharmacy for pneumonia vaccine, make sure if you have a co-pay first.

## 2020-12-06 NOTE — Progress Notes (Signed)
I,Tianna Badgett,acting as a Education administrator for [employer], FNP.,have documented all relevant documentation on the behalf of Minette Brine, FNP,as directed by  Minette Brine, FNP while in the presence of Minette Brine, [place].  This visit occurred during the SARS-CoV-2 public health emergency.  Safety protocols were in place, including screening questions prior to the visit, additional usage of staff PPE, and extensive cleaning of exam room while observing appropriate contact time as indicated for disinfecting solutions.  Subjective:     Patient ID: Tamara West , female    DOB: July 14, 1956 , 65 y.o.   MRN: [id number]   Chief Complaint  Patient presents with   Annual Exam    HPI  Here for HM.   Hypertension This is a chronic problem. The current episode started more than 1 year ago. The problem is unchanged. The problem is controlled. Pertinent negatives include no anxiety. There are no associated agents to hypertension. Risk factors for coronary artery disease include obesity and sedentary lifestyle. Past treatments include ACE inhibitors. There are no compliance problems.  There is no history of angina. Identifiable causes of hypertension include a thyroid problem.  Thyroid Problem Presents for follow-up visit. Patient reports no anxiety or fatigue. The symptoms have been resolved.    Past Medical History:  Diagnosis Date   Allergy    seasonal   Anxiety    Depression    GERD (gastroesophageal reflux disease)    Hyperlipidemia    Hypertension    Sleep apnea    mild, was not put on CPAP   Thyroid disease      Family History  Problem Relation Age of Onset   Stroke Father    Hypertension Sister    Hypertension Brother    Colon cancer Neg Hx    Breast cancer Neg Hx    Colon polyps Neg Hx    Esophageal cancer Neg Hx    Stomach cancer Neg Hx    Rectal cancer Neg Hx      Current Outpatient Medications:    ASPIRIN 81 PO, Take by mouth. Take one tablet daily, Disp: , Rfl:     levothyroxine (SYNTHROID) 25 MCG tablet, TAKE 1 TABLET (25 MCG TOTAL) BY MOUTH DAILY BEFORE BREAKFAST., Disp: 90 tablet, Rfl: 1   olmesartan (BENICAR) 20 MG tablet, TAKE 1 TABLET BY MOUTH EVERY DAY, Disp: 90 tablet, Rfl: 1   simvastatin (ZOCOR) 20 MG tablet, Take 1 tablet (20 mg total) by mouth daily at 6 PM., Disp: 90 tablet, Rfl: 1   VITAMIN D PO, Take by mouth. Take 1 tablet daily, Disp: , Rfl:    Allergies  Allergen Reactions   Penicillins Swelling   Sulfa Antibiotics Other (See Comments)    knots      The patient states she uses status post hysterectomy for birth control.  Negative for: breast discharge, breast lump(s), breast pain and breast self exam. Associated symptoms include abnormal vaginal bleeding. Pertinent negatives include abnormal bleeding (hematology), anxiety, decreased libido, depression, difficulty falling sleep, dyspareunia, history of infertility, nocturia, sexual dysfunction, sleep disturbances, urinary incontinence, urinary urgency, vaginal discharge and vaginal itching. Diet regular; avoids pork.  The patient states her exercise level is minimal with walking and gardening. She also has a dog that she walks.   The patient's tobacco use is:  Social History   Tobacco Use  Smoking Status Former   Packs/day: 0.50   Years: 3.00   Pack years: 1.50   Types: Cigarettes   Quit date: 1989  Years since quitting: 33.4  Smokeless Tobacco Never  Tobacco Comments   Quit 1989   She has been exposed to passive smoke. The patient's alcohol use is:  Social History   Substance and Sexual Activity  Alcohol Use No   Alcohol/week: 0.0 standard drinks    Review of Systems  Constitutional: Negative.  Negative for fatigue.  HENT: Negative.    Eyes: Negative.   Respiratory: Negative.    Cardiovascular: Negative.   Gastrointestinal: Negative.   Endocrine: Negative.   Genitourinary: Negative.   Musculoskeletal: Negative.   Skin: Negative.   Allergic/Immunologic:  Negative.   Neurological: Negative.   Hematological: Negative.   Psychiatric/Behavioral: Negative.  The patient is not nervous/anxious.     Today's Vitals   12/06/20 1503  BP: 138/76  Pulse: 80  Temp: 98.4 F (36.9 C)  TempSrc: Oral  Weight: 162 lb (73.5 kg)  Height: '4\' 11"'  (1.499 m)   Body mass index is 32.72 kg/m.   Objective:  Physical Exam Constitutional:      General: She is not in acute distress.    Appearance: Normal appearance. She is well-developed. She is obese.  HENT:     Head: Normocephalic and atraumatic.     Right Ear: Hearing, tympanic membrane, ear canal and external ear normal. There is no impacted cerumen.     Left Ear: Hearing, tympanic membrane, ear canal and external ear normal. There is no impacted cerumen.     Nose:     Comments: Deferred - mask    Mouth/Throat:     Comments: Deferred - mask Eyes:     General: Lids are normal.     Extraocular Movements: Extraocular movements intact.     Conjunctiva/sclera: Conjunctivae normal.     Pupils: Pupils are equal, round, and reactive to light.     Funduscopic exam:    Right eye: No papilledema.        Left eye: No papilledema.  Neck:     Thyroid: No thyroid mass.     Vascular: No carotid bruit.  Cardiovascular:     Rate and Rhythm: Normal rate and regular rhythm.     Pulses: Normal pulses.     Heart sounds: Normal heart sounds. No murmur heard. Pulmonary:     Effort: Pulmonary effort is normal. No respiratory distress.     Breath sounds: Normal breath sounds.  Chest:  Breasts:    Right: Normal. No mass, tenderness, axillary adenopathy or supraclavicular adenopathy.     Left: Normal. No mass, tenderness, axillary adenopathy or supraclavicular adenopathy.  Abdominal:     General: Abdomen is flat. Bowel sounds are normal. There is no distension.     Palpations: Abdomen is soft.  Musculoskeletal:        General: No swelling. Normal range of motion.     Cervical back: Full passive range of motion  without pain, normal range of motion and neck supple.     Right lower leg: No edema.     Left lower leg: No edema.  Lymphadenopathy:     Upper Body:     Right upper body: No supraclavicular or axillary adenopathy.     Left upper body: No supraclavicular or axillary adenopathy.  Skin:    General: Skin is warm and dry.     Capillary Refill: Capillary refill takes less than 2 seconds.  Neurological:     General: No focal deficit present.     Mental Status: She is alert and oriented to person, place,  and time.     Cranial Nerves: No cranial nerve deficit.     Sensory: No sensory deficit.  Psychiatric:        Mood and Affect: Mood normal.        Behavior: Behavior normal.        Thought Content: Thought content normal.        Judgment: Judgment normal.        Assessment And Plan:     1. Health maintenance examination  2. Essential (primary) hypertension Comments: Chronc, fair control no changes at this time - POCT Urinalysis Dipstick (81002) - POCT UA - Microalbumin - EKG 12-Lead - CBC - CMP14+EGFR  3. Acquired hypothyroidism Comments: Chronic, stable continue current medications - TSH - T4 - T3, free  4. Elevated cholesterol Comments: Chronic, stable tolerating medications well - CBC - CMP14+EGFR - Lipid panel  5. Anxiety Comments: Chronic, she has been doing better recently  6. History of COVID-19  7. Impacted cerumen of right ear Comments: ear lavage done with good results encouraged to avoid using Qtips - Ear Lavage  8. Leukocytes in urine Comments: Small amount of white cells in urine, will send urine for culture - Urine Culture     Patient was given opportunity to ask questions. Patient verbalized understanding of the plan and was able to repeat key elements of the plan. All questions were answered to their satisfaction.   Minette Brine, FNP   I, Minette Brine, FNP, have reviewed all documentation for this visit. The documentation on 01/04/21 for  the exam, diagnosis, procedures, and orders are all accurate and complete.   THE PATIENT IS ENCOURAGED TO PRACTICE SOCIAL DISTANCING DUE TO THE COVID-19 PANDEMIC.

## 2020-12-07 LAB — LIPID PANEL
Chol/HDL Ratio: 3.4 ratio (ref 0.0–4.4)
Cholesterol, Total: 210 mg/dL — ABNORMAL HIGH (ref 100–199)
HDL: 62 mg/dL (ref >39)
LDL Chol Calc (NIH): 132 mg/dL — ABNORMAL HIGH (ref 0–99)
Triglycerides: 92 mg/dL (ref 0–149)
VLDL Cholesterol Cal: 16 mg/dL (ref 5–40)

## 2020-12-07 LAB — CMP14+EGFR
ALT: 20 IU/L (ref 0–32)
AST: 23 IU/L (ref 0–40)
Albumin/Globulin Ratio: 1.3 (ref 1.2–2.2)
Albumin: 4.8 g/dL (ref 3.8–4.8)
Alkaline Phosphatase: 106 IU/L (ref 44–121)
BUN/Creatinine Ratio: 15 (ref 12–28)
BUN: 15 mg/dL (ref 8–27)
Bilirubin Total: 0.2 mg/dL (ref 0.0–1.2)
CO2: 21 mmol/L (ref 20–29)
Calcium: 10 mg/dL (ref 8.7–10.3)
Chloride: 101 mmol/L (ref 96–106)
Creatinine, Ser: 0.97 mg/dL (ref 0.57–1.00)
Globulin, Total: 3.6 g/dL (ref 1.5–4.5)
Glucose: 76 mg/dL (ref 65–99)
Potassium: 4.3 mmol/L (ref 3.5–5.2)
Sodium: 140 mmol/L (ref 134–144)
Total Protein: 8.4 g/dL (ref 6.0–8.5)
eGFR: 65 mL/min/{1.73_m2} (ref >59)

## 2020-12-07 LAB — CBC
Hematocrit: 37.5 % (ref 34.0–46.6)
Hemoglobin: 12.3 g/dL (ref 11.1–15.9)
MCH: 28.7 pg (ref 26.6–33.0)
MCHC: 32.8 g/dL (ref 31.5–35.7)
MCV: 88 fL (ref 79–97)
Platelets: 225 10*3/uL (ref 150–450)
RBC: 4.28 x10E6/uL (ref 3.77–5.28)
RDW: 12.3 % (ref 11.7–15.4)
WBC: 7.7 10*3/uL (ref 3.4–10.8)

## 2020-12-07 LAB — T4: T4, Total: 9.7 ug/dL (ref 4.5–12.0)

## 2020-12-07 LAB — T3, FREE: T3, Free: 2.6 pg/mL (ref 2.0–4.4)

## 2020-12-07 LAB — TSH: TSH: 4.2 u[IU]/mL (ref 0.450–4.500)

## 2020-12-07 NOTE — Progress Notes (Signed)
Please see previous message

## 2020-12-08 LAB — URINE CULTURE

## 2021-01-04 ENCOUNTER — Other Ambulatory Visit: Payer: Self-pay

## 2021-01-04 ENCOUNTER — Ambulatory Visit
Admission: RE | Admit: 2021-01-04 | Discharge: 2021-01-04 | Disposition: A | Discharge status: Home / Self Care | Payer: Medicare PPO | Source: Ambulatory Visit | Attending: Nurse Practitioner | Admitting: Nurse Practitioner

## 2021-01-04 DIAGNOSIS — Z780 Asymptomatic menopausal state: Secondary | ICD-10-CM | POA: Diagnosis not present

## 2021-01-04 DIAGNOSIS — M8589 Other specified disorders of bone density and structure, multiple sites: Secondary | ICD-10-CM | POA: Diagnosis not present

## 2021-01-04 DIAGNOSIS — E2839 Other primary ovarian failure: Secondary | ICD-10-CM

## 2021-01-31 ENCOUNTER — Other Ambulatory Visit: Payer: Self-pay | Admitting: Internal Medicine

## 2021-01-31 DIAGNOSIS — Z1231 Encounter for screening mammogram for malignant neoplasm of breast: Secondary | ICD-10-CM

## 2021-01-31 IMAGING — MG DIGITAL SCREENING BILAT W/ TOMO W/ CAD
8 series · 8 of 24 positions shown · non-contrast
Comparison: Previous exam(s).

CLINICAL DATA: Screening.

EXAM:
DIGITAL SCREENING BILATERAL MAMMOGRAM WITH TOMO AND CAD

[R MLO synth-2D]
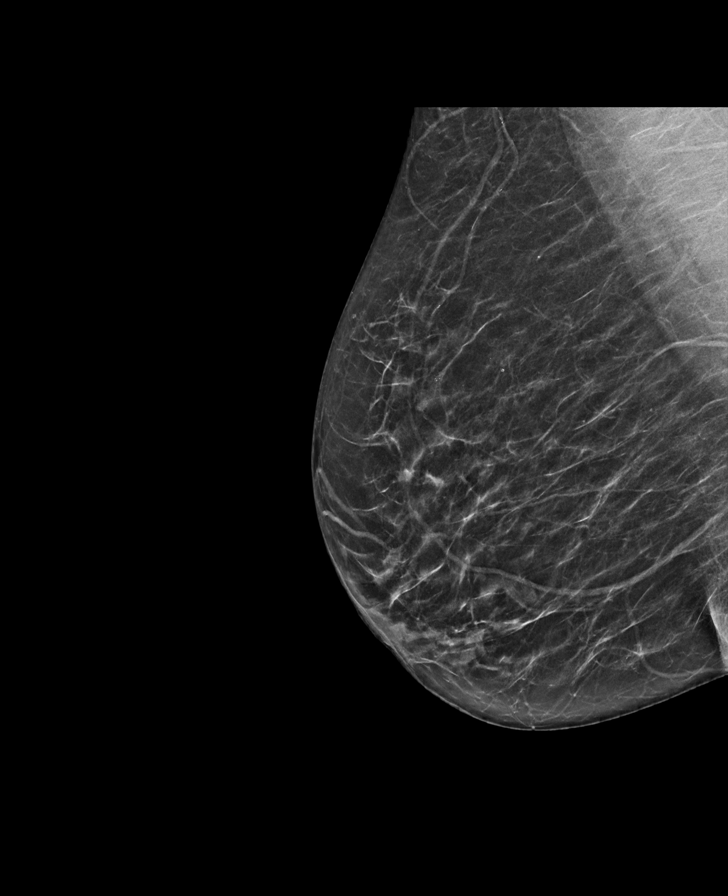

[L CC synth-2D]
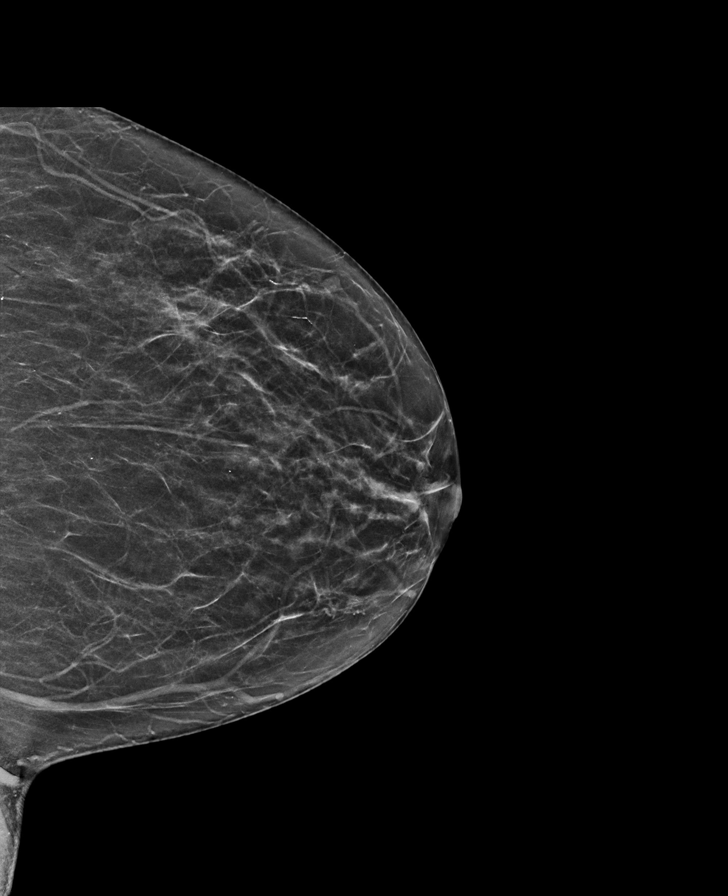

[L MLO synth-2D]
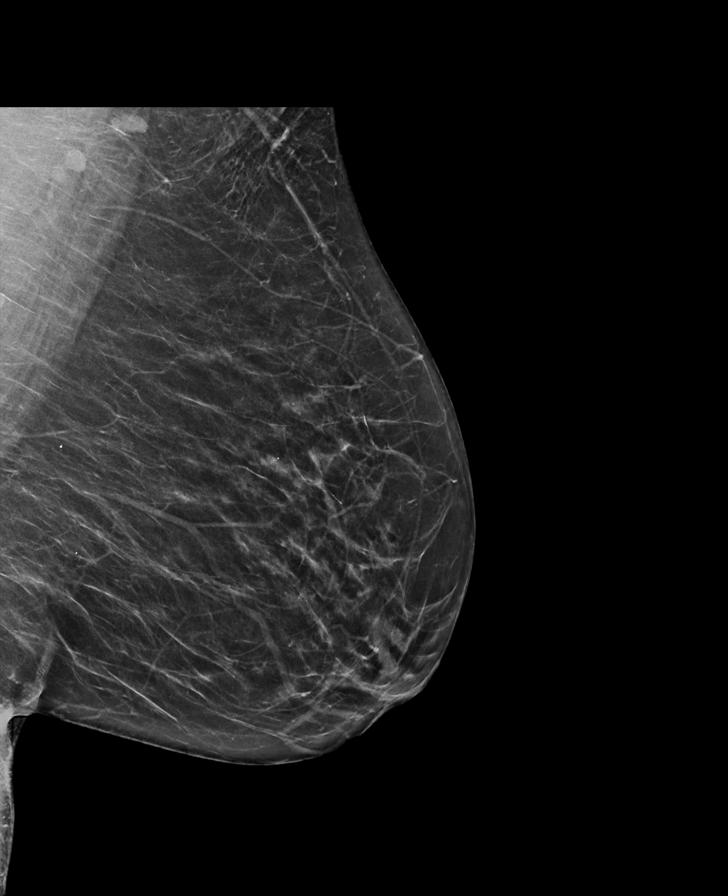

[R CC synth-2D]
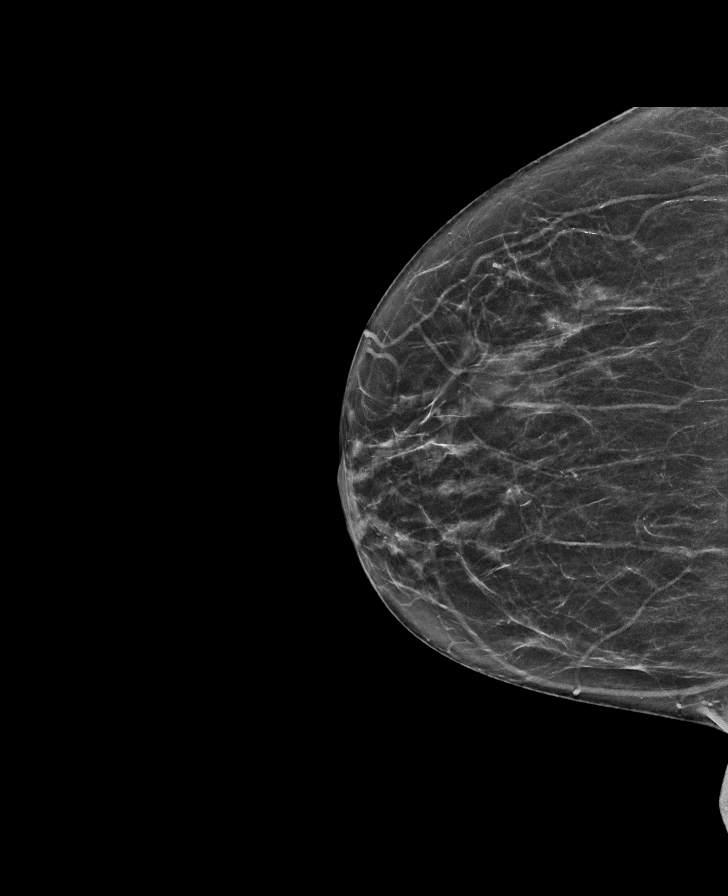

[L MLO tomo · tomo slice 31/61.0]
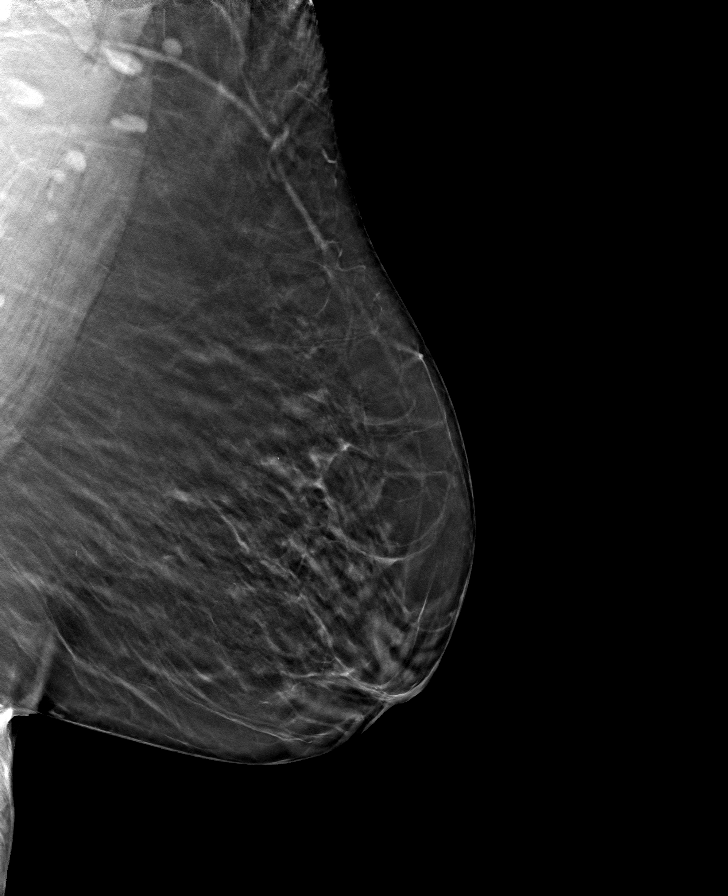

[R CC tomo · tomo slice 29/56.0]
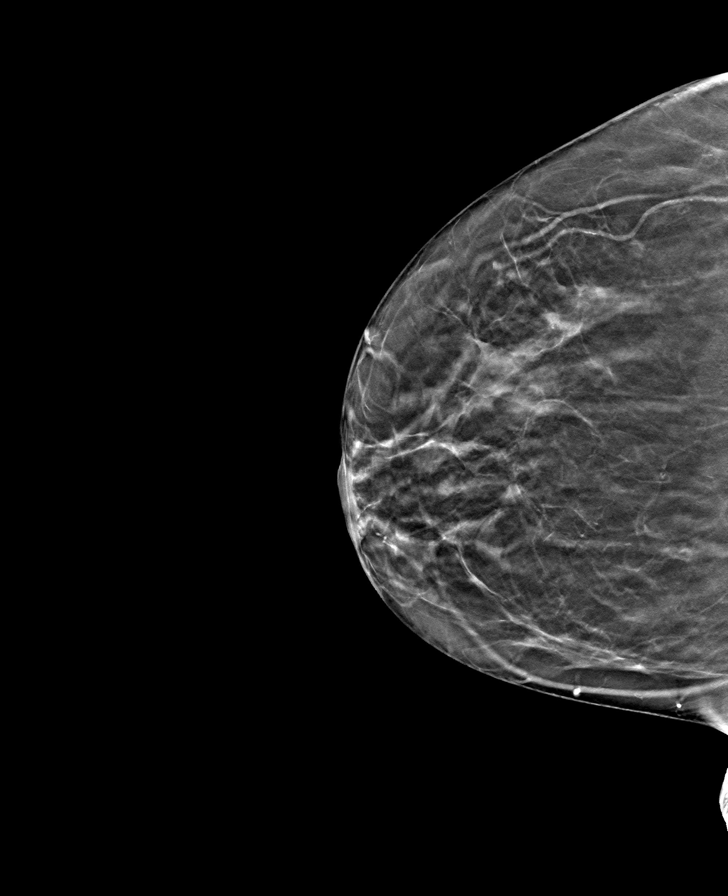

[R MLO tomo · tomo slice 29/58.0]
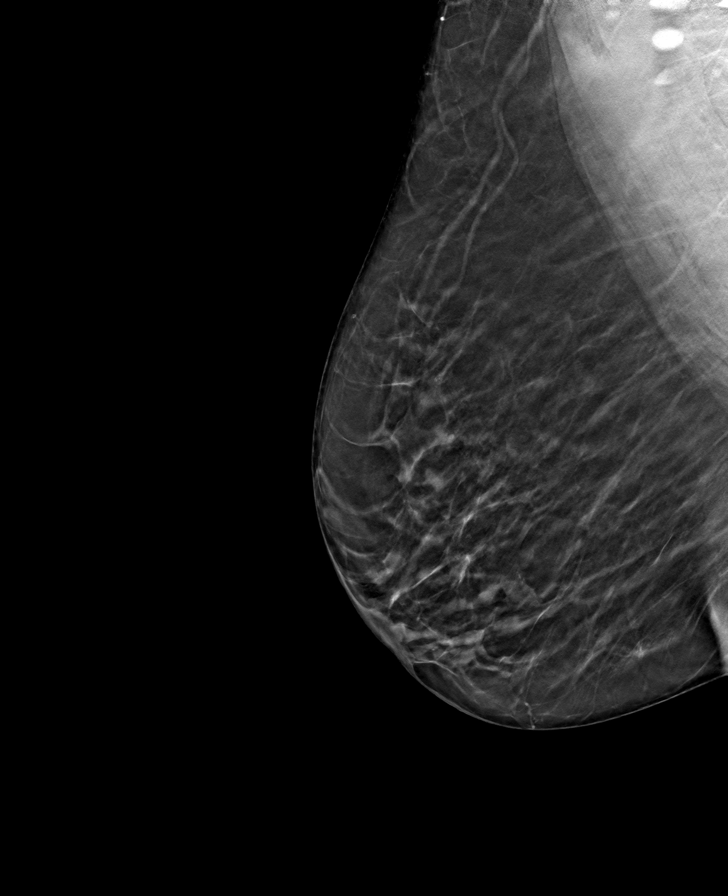

[L CC tomo · tomo slice 29/56.0]
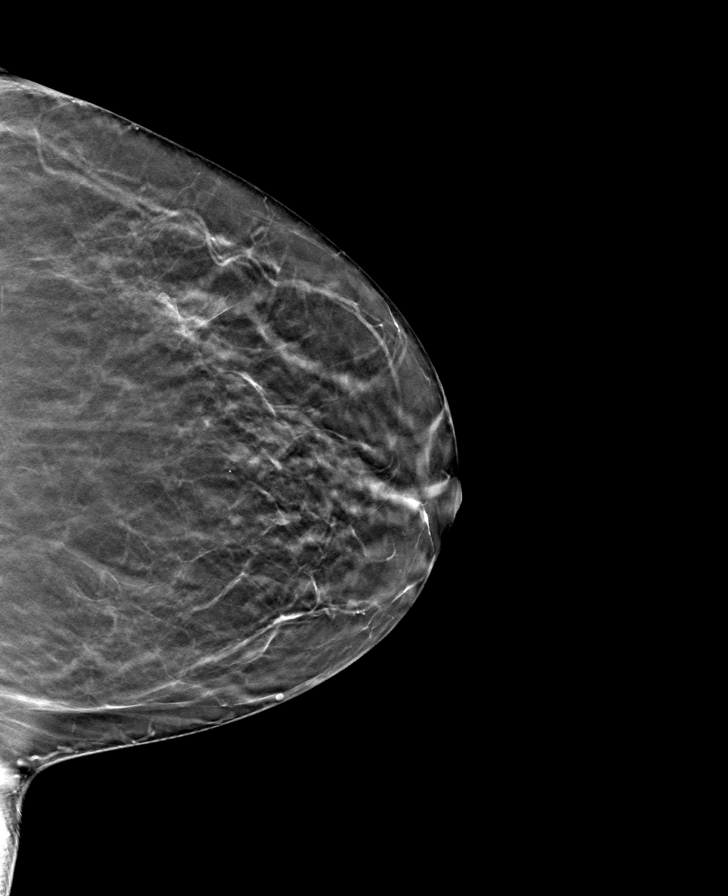

[8 of 24 positions shown; findings below may reference images not displayed]

ACR Breast Density Category b: There are scattered areas of
fibroglandular density.
FINDINGS: There are no findings suspicious for malignancy. Images were
processed with CAD.
IMPRESSION: No mammographic evidence of malignancy. A result letter of this
screening mammogram will be mailed directly to the patient.

RECOMMENDATION:
Screening mammogram in one year. (Code:[id number])

BI-RADS CATEGORY  1: Negative.

## 2021-02-07 ENCOUNTER — Telehealth: Payer: Self-pay | Admitting: Nurse Practitioner

## 2021-02-07 NOTE — Telephone Encounter (Signed)
Left message asking pt to call office  Appointment 03/28/21  welcome to medicare should be with provider not [facility]  please change

## 2021-02-15 ENCOUNTER — Ambulatory Visit: Payer: Medicare PPO | Admitting: Nurse Practitioner

## 2021-03-05 ENCOUNTER — Telehealth: Payer: Self-pay | Admitting: Nurse Practitioner

## 2021-03-05 NOTE — Telephone Encounter (Signed)
I spoke with patient and transferred patient to office to have appointment r/s to provider.  WTM needs to be with provider not N[facility].   I didn't get person name that answered phone.  I explained appointment needed to be r/s to PCP.  That WTM has to be with provider, I didn't know where to schedule patient.  Tamara West schedule full.  She stated she would r/s appointment/

## 2021-03-08 ENCOUNTER — Other Ambulatory Visit: Payer: Self-pay | Admitting: Nurse Practitioner

## 2021-03-08 DIAGNOSIS — E7800 Pure hypercholesterolemia, unspecified: Secondary | ICD-10-CM

## 2021-03-22 ENCOUNTER — Encounter: Payer: Self-pay | Admitting: Nurse Practitioner

## 2021-03-22 ENCOUNTER — Telehealth (INDEPENDENT_AMBULATORY_CARE_PROVIDER_SITE_OTHER): Payer: Medicare PPO | Admitting: Nurse Practitioner

## 2021-03-22 VITALS — Temp 99.6°F | Ht 59.0 in | Wt 156.0 lb

## 2021-03-22 DIAGNOSIS — R059 Cough, unspecified: Secondary | ICD-10-CM | POA: Diagnosis not present

## 2021-03-22 DIAGNOSIS — U071 COVID-19: Secondary | ICD-10-CM | POA: Diagnosis not present

## 2021-03-22 DIAGNOSIS — R0989 Other specified symptoms and signs involving the circulatory and respiratory systems: Secondary | ICD-10-CM | POA: Diagnosis not present

## 2021-03-22 MED ORDER — AZITHROMYCIN 250 MG PO TABS
ORAL_TABLET | ORAL | 0 refills | Status: AC
Start: 2021-03-22 — End: 2021-03-27

## 2021-03-22 MED ORDER — BENZONATATE 100 MG PO CAPS: 100.0000 mg | ORAL_CAPSULE | Freq: Three times a day (TID) | ORAL | 1 refills | Status: AC | PRN

## 2021-03-22 NOTE — Progress Notes (Signed)
Virtual Visit via 100% video    This visit type was conducted due to national recommendations for restrictions regarding the COVID-19 Pandemic (e.g. social distancing) in an effort to limit this patient's exposure and mitigate transmission in our community.  Due to her co-morbid illnesses, this patient is at least at moderate risk for complications without adequate follow up.  This format is felt to be most appropriate for this patient at this time.  All issues noted in this document were discussed and addressed.  A limited physical exam was performed with this format.    This visit type was conducted due to national recommendations for restrictions regarding the COVID-19 Pandemic (e.g. social distancing) in an effort to limit this patient's exposure and mitigate transmission in our community.  Patients identity confirmed using two different identifiers.  This format is felt to be most appropriate for this patient at this time.  All issues noted in this document were discussed and addressed.  No physical exam was performed (except for noted visual exam findings with Video Visits).    Date:  03/22/2021   ID:  Tamara West, DOB August 17, 1955, MRN [id number]  Patient Location:  Home   Provider location:   Office    Chief Complaint:  Tested positive for covid   History of Present Illness:    Tamara West is a 65 y.o. female who presents via video conferencing for a telehealth visit today.    The patient does have symptoms concerning for COVID-19 infection (fever, chills, cough, or new shortness of breath).   She is having headache, fever, cough and cold, congestion. She feels very weak and nervous. She feels like she has a fever but has not tested it. She has chills. She is vaccinated x 2 and a booster.   URI  Associated symptoms include congestion, coughing, headaches and sinus pain. Pertinent negatives include no chest pain, diarrhea, nausea, vomiting or wheezing.    Past Medical  History:  Diagnosis Date   Allergy    seasonal   Anxiety    Depression    GERD (gastroesophageal reflux disease)    Hyperlipidemia    Hypertension    Sleep apnea    mild, was not put on CPAP   Thyroid disease    Past Surgical History:  Procedure Laterality Date   ABDOMINAL HYSTERECTOMY     COLONOSCOPY  2004   w/Dr.Mann=hemorrhoid    POLYPECTOMY       Current Meds  Medication Sig   ASPIRIN 81 PO Take by mouth. Take one tablet daily   levothyroxine (SYNTHROID) 25 MCG tablet TAKE 1 TABLET (25 MCG TOTAL) BY MOUTH DAILY BEFORE BREAKFAST.   olmesartan (BENICAR) 20 MG tablet TAKE 1 TABLET BY MOUTH EVERY DAY   simvastatin (ZOCOR) 20 MG tablet TAKE 1 TABLET BY MOUTH DAILY AT 6 PM.   VITAMIN D PO Take by mouth. Take 1 tablet daily     Allergies:   Penicillins and Sulfa antibiotics   Social History   Tobacco Use   Smoking status: Former    Packs/day: 0.50    Years: 3.00    Pack years: 1.50    Types: Cigarettes    Quit date: 1989    Years since quitting: 33.7   Smokeless tobacco: Never   Tobacco comments:    Quit 1989  Vaping Use   Vaping Use: Never used  Substance Use Topics   Alcohol use: No    Alcohol/week: 0.0 standard drinks   Drug use:  No     Family Hx: The patient's family history includes Hypertension in her brother and sister; Stroke in her father. There is no history of Colon cancer, Breast cancer, Colon polyps, Esophageal cancer, Stomach cancer, or Rectal cancer.  ROS:   Please see the history of present illness.    Review of Systems  Constitutional:  Positive for chills, fever and malaise/fatigue.  HENT:  Positive for congestion and sinus pain.   Respiratory:  Positive for cough. Negative for shortness of breath and wheezing.   Cardiovascular:  Negative for chest pain.  Gastrointestinal:  Negative for constipation, diarrhea, nausea and vomiting.  Musculoskeletal:  Negative for myalgias.  Neurological:  Positive for headaches.   All other systems  reviewed and are negative.   Labs/Other Tests and Data Reviewed:    Recent Labs: 12/06/2020: ALT 20; BUN 15; Creatinine, Ser 0.97; Hemoglobin 12.3; Platelets 225; Potassium 4.3; Sodium 140; TSH 4.200   Recent Lipid Panel Lab Results  Component Value Date/Time   CHOL 210 (H) 12/06/2020 03:45 PM   TRIG 92 12/06/2020 03:45 PM   HDL 62 12/06/2020 03:45 PM   CHOLHDL 3.4 12/06/2020 03:45 PM   LDLCALC 132 (H) 12/06/2020 03:45 PM    Wt Readings from Last 3 Encounters:  03/22/21 156 lb (70.8 kg)  12/06/20 162 lb (73.5 kg)  10/03/20 165 lb (74.8 kg)     Exam:    Vital Signs:  Temp 99.6 F (37.6 C)   Ht '4\' 11"'$  (1.499 m)   Wt 156 lb (70.8 kg)   BMI 31.51 kg/m     Physical Exam Vitals and nursing note reviewed.  Constitutional:      Appearance: Normal appearance.  HENT:     Head: Normocephalic and atraumatic.  Pulmonary:     Effort: Pulmonary effort is normal.  Neurological:     Mental Status: She is alert and oriented to person, place, and time.  Psychiatric:        Mood and Affect: Affect normal.    ASSESSMENT & PLAN:    1. COVID-19 virus infection -tested postive this morning for covid. Has symptoms of fever, chills, cough, HA, congestion, fatigue.  -She is vaccinated -Offered anti-viral meds the patient refused.  -Advised patient to take Vitamin C, D, Zinc.  Keep yourself hydrated with a lot of water and rest. Take Delsym for cough and Mucinex as need. Take Tylenol or pain reliever every 4-6 hours as needed for pain/fever/body ache. If you have elevated blood pressure, you can take OTC Corcidin. You can also take OTC oscillococcinum to help with your symptoms.  Educated patient if symptoms get worse or if she experiences any SOB, chest pain or pain in her legs to seek immediate emergency care. Continue to monitor your oxygen levels. Call us if you have any questions. Quarantine for 5 days if tested positive and no symptoms or 10 days if tested positive and have symptoms.  Wear a mask around other people.    2. Chest congestion  - azithromycin (ZITHROMAX Z-PAK) 250 MG tablet; Take 2 tablets (500 mg) on  Day 1,  followed by 1 tablet (250 mg) once daily on Days 2 through 5.  Dispense: 6 each; Refill: 0  3. Cough - benzonatate (TESSALON PERLES) 100 MG capsule; Take 1 capsule (100 mg total) by mouth 3 (three) times daily as needed for cough.  Dispense: 30 capsule; Refill: 1  Follow up: if symptoms persist or do not get better.   The patient was encouraged  to call or send a message through [facility] for any questions or concerns.   Side effects and appropriate use of all the medication(s) were discussed with the patient today. Patient advised to use the medication(s) as directed by their healthcare provider. The patient was encouraged to read, review, and understand all associated package inserts and contact our office with any questions or concerns. The patient accepts the risks of the treatment plan and had an opportunity to ask questions.   Patient was given opportunity to ask questions. Patient verbalized understanding of the plan and was able to repeat key elements of the plan. All questions were answered to their satisfaction.  Raman Atha Muradyan, DNP   I, Raman Austan Nicholl have reviewed all documentation for this visit. The documentation on 9/8//22 for the exam, diagnosis, procedures, and orders are all accurate and complete.     COVID-19 Education: The signs and symptoms of COVID-19 were discussed with the patient and how to seek care for testing (follow up with PCP or arrange E-visit).  The importance of social distancing was discussed today.  Patient Risk:   After full review of this patients clinical status, I feel that they are at least moderate risk at this time.  Time:   Today, I have spent 15 minutes/ seconds with the patient with telehealth technology discussing above diagnoses.     Medication Adjustments/Labs and Tests Ordered: Current medicines are  reviewed at length with the patient today.  Concerns regarding medicines are outlined above.   Tests Ordered: No orders of the defined types were placed in this encounter.   Medication Changes: No orders of the defined types were placed in this encounter.   Disposition:  Follow up prn  Signed, Bary Castilla, NP

## 2021-03-27 ENCOUNTER — Ambulatory Visit: Payer: Medicare PPO

## 2021-03-28 ENCOUNTER — Ambulatory Visit: Payer: Medicare PPO

## 2021-04-06 ENCOUNTER — Ambulatory Visit
Admission: RE | Admit: 2021-04-06 | Discharge: 2021-04-06 | Disposition: A | Discharge status: Home / Self Care | Payer: Medicare PPO | Source: Ambulatory Visit | Attending: Internal Medicine | Admitting: Internal Medicine

## 2021-04-06 ENCOUNTER — Other Ambulatory Visit: Payer: Self-pay

## 2021-04-06 DIAGNOSIS — Z1231 Encounter for screening mammogram for malignant neoplasm of breast: Secondary | ICD-10-CM | POA: Diagnosis not present

## 2021-04-17 ENCOUNTER — Other Ambulatory Visit: Payer: Self-pay

## 2021-04-17 ENCOUNTER — Ambulatory Visit (INDEPENDENT_AMBULATORY_CARE_PROVIDER_SITE_OTHER): Payer: Medicare PPO | Admitting: Nurse Practitioner

## 2021-04-17 ENCOUNTER — Encounter: Payer: Self-pay | Admitting: Nurse Practitioner

## 2021-04-17 VITALS — BP 124/70 | HR 81 | Temp 99.1°F | Ht 59.6 in | Wt 155.6 lb

## 2021-04-17 DIAGNOSIS — Z0000 Encounter for general adult medical examination without abnormal findings: Secondary | ICD-10-CM | POA: Diagnosis not present

## 2021-04-17 DIAGNOSIS — R070 Pain in throat: Secondary | ICD-10-CM

## 2021-04-17 DIAGNOSIS — E7800 Pure hypercholesterolemia, unspecified: Secondary | ICD-10-CM

## 2021-04-17 DIAGNOSIS — E039 Hypothyroidism, unspecified: Secondary | ICD-10-CM | POA: Diagnosis not present

## 2021-04-17 DIAGNOSIS — I10 Essential (primary) hypertension: Secondary | ICD-10-CM | POA: Diagnosis not present

## 2021-04-17 DIAGNOSIS — Z79899 Other long term (current) drug therapy: Secondary | ICD-10-CM

## 2021-04-17 DIAGNOSIS — R82998 Other abnormal findings in urine: Secondary | ICD-10-CM | POA: Diagnosis not present

## 2021-04-17 LAB — POCT UA - MICROALBUMIN
Albumin/Creatinine Ratio, Urine, POC: <30
Creatinine, POC: 300 mg/dL
Microalbumin Ur, POC: 30 mg/L

## 2021-04-17 LAB — POCT URINALYSIS DIPSTICK
Bilirubin, UA: NEGATIVE
Blood, UA: NEGATIVE
Glucose, UA: NEGATIVE
Ketones, UA: NEGATIVE
Nitrite, UA: NEGATIVE
Protein, UA: NEGATIVE
Spec Grav, UA: 1.025 (ref 1.010–1.025)
Urobilinogen, UA: 0.2 E.U./dL
pH, UA: 5.5 (ref 5.0–8.0)

## 2021-04-17 NOTE — Progress Notes (Signed)
I,Katawbba Wiggins,acting as a Education administrator for [employer], FNP.,have documented all relevant documentation on the behalf of Minette Brine, FNP,as directed by  Minette Brine, FNP while in the presence of Minette Brine, [place].     Subjective:    Tamara West is a 65 y.o. female who presents for a Welcome to Medicare exam.   Review of Systems Review of Systems  Constitutional: Negative.   HENT:  Positive for sore throat (after talking for long periods).   Eyes: Negative.   Respiratory: Negative.    Cardiovascular: Negative.   Gastrointestinal: Negative.   Genitourinary: Negative.   Musculoskeletal: Negative.   Skin: Negative.   Neurological: Negative.   Endo/Heme/Allergies: Negative.   Psychiatric/Behavioral: Negative.     Cardiac Risk Factors include: hypertension      Objective:    Today's Vitals   04/17/21 1128  BP: 124/70  Pulse: 81  Temp: 99.1 F (37.3 C)  Weight: 155 lb 9.6 oz (70.6 kg)  Height: 4' 11.6" (1.514 m)  PainSc: 0-No pain  Body mass index is 30.8 kg/m. Wt Readings from Last 3 Encounters:  04/17/21 155 lb 9.6 oz (70.6 kg)  03/22/21 156 lb (70.8 kg)  12/06/20 162 lb (73.5 kg)    BP Readings from Last 3 Encounters:  04/17/21 124/70  12/06/20 138/76  10/03/20 134/65    Medications Outpatient Encounter Medications as of 04/17/2021  Medication Sig   ASPIRIN 81 PO Take by mouth. Take one tablet daily   benzonatate (TESSALON PERLES) 100 MG capsule Take 1 capsule (100 mg total) by mouth 3 (three) times daily as needed for cough.   levothyroxine (SYNTHROID) 25 MCG tablet TAKE 1 TABLET (25 MCG TOTAL) BY MOUTH DAILY BEFORE BREAKFAST.   olmesartan (BENICAR) 20 MG tablet TAKE 1 TABLET BY MOUTH EVERY DAY   simvastatin (ZOCOR) 20 MG tablet TAKE 1 TABLET BY MOUTH DAILY AT 6 PM.   VITAMIN D PO Take by mouth. Take 1 tablet daily   No facility-administered encounter medications on file as of 04/17/2021.     History: Past Medical History:  Diagnosis Date   Allergy     seasonal   Anxiety    Depression    GERD (gastroesophageal reflux disease)    Hyperlipidemia    Hypertension    Sleep apnea    mild, was not put on CPAP   Thyroid disease    Past Surgical History:  Procedure Laterality Date   ABDOMINAL HYSTERECTOMY     COLONOSCOPY  2004   w/Dr.Mann=hemorrhoid    POLYPECTOMY      Family History  Problem Relation Age of Onset   Stroke Father    Hypertension Sister    Hypertension Brother    Colon cancer Neg Hx    Breast cancer Neg Hx    Colon polyps Neg Hx    Esophageal cancer Neg Hx    Stomach cancer Neg Hx    Rectal cancer Neg Hx    Social History   Occupational History   Occupation: International aid/development worker: [employer]  Tobacco Use   Smoking status: Former    Packs/day: 0.50    Years: 3.00    Pack years: 1.50    Types: Cigarettes    Quit date: 1989    Years since quitting: 33.7   Smokeless tobacco: Never   Tobacco comments:    Quit 1989  Vaping Use   Vaping Use: Never used  Substance and Sexual Activity   Alcohol use: No  Alcohol/week: 0.0 standard drinks   Drug use: No   Sexual activity: Not Currently    Tobacco Counseling Counseling given: Not Answered Tobacco comments: Quit 1989   Immunizations and Health Maintenance Immunization History  Administered Date(s) Administered   PFIZER(Purple Top)SARS-COV-2 Vaccination 11/16/2019, 12/07/2019, 07/09/2020   PNEUMOCOCCAL CONJUGATE-20 12/18/2020   Health Maintenance Due  Topic Date Due   COVID-19 Vaccine (4 - Booster for [place] series) 11/07/2020    Activities of Daily Living In your present state of health, do you have any difficulty performing the following activities: 04/17/2021 04/17/2021  Hearing? N N  Vision? N N  Difficulty concentrating or making decisions? N N  Walking or climbing stairs? N N  Dressing or bathing? N N  Doing errands, shopping? N N  Preparing Food and eating ? N N  Using the Toilet? N N  In the past six months, have you accidently  leaked urine? N N  Do you have problems with loss of bowel control? N N  Managing your Medications? N N  Managing your Finances? N N  Housekeeping or managing your Housekeeping? N N  Some recent data might be hidden    Physical Exam  (optional), or other factors deemed appropriate based on the beneficiary's medical and social history and current clinical standards. Physical Exam Vitals reviewed.  Constitutional:      General: She is not in acute distress.    Appearance: Normal appearance.  HENT:     Mouth/Throat:     Tonsils: 1+ on the right. 0 on the left.     Comments: Negative lymphadenopathy Neurological:     Mental Status: She is alert.     Advanced Directives: Does Patient Have a Medical Advance Directive?: No Would patient like information on creating a medical advance directive?: Yes (MAU/Ambulatory/Procedural Areas - Information given)    Assessment:    This is a routine wellness examination for this patient .   Vision/Hearing screen Hearing Screening  Method: Audiometry   _0  _1  _2  _3  _4   Right ear _5 Left ear _6 Vision Screening   Right eye Left eye Both eyes  Without correction _7  With correction       Dietary issues and exercise activities discussed:  Current Exercise Habits: Home exercise routine, Type of exercise: walking (yard work), Time (Minutes): 60, Frequency (Times/Week): 7, Weekly Exercise (Minutes/Week): 420, Intensity: Intense   Goals      Weight (lb) < 200 lb (90.7 kg)     "I want to get my weight down to 145 lbs"        Depression Screen PHQ 2/9 Scores 04/17/2021 12/06/2020 09/16/2019 03/15/2019  PHQ - 2 Score 0 0 0 0  PHQ- 9 Score - 0 - -     Fall Risk Fall Risk  04/17/2021  Falls in the past year? 0  Number falls in past yr: 0  Injury with Fall? 0    Cognitive Function:     6CIT Screen 04/17/2021  What Year? 0 points  What month? 0 points  What time? 0 points   Count back from 20 0 points  Months in reverse 0 points  Repeat phrase 0 points  Total Score 0    Patient Care Team: Minette Brine, FNP as PCP - General ([place]) Burt Ek, Gayland Curry, FNP as Nurse Practitioner (Psychiatry)     Plan:   1. Encounter for Medicare annual wellness exam  Pt's annual wellness exam was performed and geriatric assessment reviewed.  Pt has no new identiafble wellness concerns at this time.  WIll obtain routine labs.  Behavior modifications discussed and diet history reviewed. Pt will continue to exercise regularly and modify diet, with low GI, plant based foods and decrease food intake of processed foods.  Recommend intake of daily multivitamin, Vitamin D, and calcium. Recommond mammogram and colonoscopy (up to date) for preventive screenings, as well as recommend immunizations that include influenza (up to date) and TDAP (up to date)  2. Essential (primary) hypertension Chronic, excellent control. Continue current medications - POCT Urinalysis Dipstick (81002) - POCT UA - Microalbumin - CMP14+EGFR  3. Acquired hypothyroidism Chronic, controlled Continue current medications  4. Elevated cholesterol Stable, continue statin, tolerating well.  - Lipid panel  5. Other long term (current) drug therapy  - CBC  6. Throat pain in adult No abnormal findings on physical exam Will refer to ENT for further evaluation, she has had covid within the last 6 months - Ambulatory referral to ENT  I have personally reviewed and noted the following in the patient's chart:   Medical and social history Use of alcohol, tobacco or illicit drugs  Current medications and supplements Functional ability and status Nutritional status Physical activity Advanced directives List of other physicians Hospitalizations, surgeries, and ER visits in previous 12 months Vitals Screenings to include cognitive, depression, and falls Referrals and  appointments  In addition, I have reviewed and discussed with patient certain preventive protocols, quality metrics, and best practice recommendations. A written personalized care plan for preventive services as well as general preventive health recommendations were provided to patient.     Minette Brine, FNP 04/17/2021

## 2021-04-18 LAB — CMP14+EGFR
ALT: 16 IU/L (ref 0–32)
AST: 26 IU/L (ref 0–40)
Albumin/Globulin Ratio: 1.5 (ref 1.2–2.2)
Albumin: 4.7 g/dL (ref 3.8–4.8)
Alkaline Phosphatase: 103 IU/L (ref 44–121)
BUN/Creatinine Ratio: 20 (ref 12–28)
BUN: 16 mg/dL (ref 8–27)
Bilirubin Total: <0.2 mg/dL (ref 0.0–1.2)
CO2: 22 mmol/L (ref 20–29)
Calcium: 9.8 mg/dL (ref 8.7–10.3)
Chloride: 101 mmol/L (ref 96–106)
Creatinine, Ser: 0.82 mg/dL (ref 0.57–1.00)
Globulin, Total: 3.2 g/dL (ref 1.5–4.5)
Glucose: 81 mg/dL (ref 70–99)
Potassium: 4.1 mmol/L (ref 3.5–5.2)
Sodium: 140 mmol/L (ref 134–144)
Total Protein: 7.9 g/dL (ref 6.0–8.5)
eGFR: 79 mL/min/{1.73_m2} (ref >59)

## 2021-04-18 LAB — LIPID PANEL
Chol/HDL Ratio: 3.8 ratio (ref 0.0–4.4)
Cholesterol, Total: 201 mg/dL — ABNORMAL HIGH (ref 100–199)
HDL: 53 mg/dL (ref >39)
LDL Chol Calc (NIH): 127 mg/dL — ABNORMAL HIGH (ref 0–99)
Triglycerides: 116 mg/dL (ref 0–149)
VLDL Cholesterol Cal: 21 mg/dL (ref 5–40)

## 2021-04-18 LAB — CBC
Hematocrit: 36.8 % (ref 34.0–46.6)
Hemoglobin: 11.8 g/dL (ref 11.1–15.9)
MCH: 28.9 pg (ref 26.6–33.0)
MCHC: 32.1 g/dL (ref 31.5–35.7)
MCV: 90 fL (ref 79–97)
Platelets: 230 10*3/uL (ref 150–450)
RBC: 4.09 x10E6/uL (ref 3.77–5.28)
RDW: 12.8 % (ref 11.7–15.4)
WBC: 7 10*3/uL (ref 3.4–10.8)

## 2021-04-19 ENCOUNTER — Encounter: Payer: Self-pay | Admitting: Nurse Practitioner

## 2021-04-20 LAB — URINE CULTURE

## 2021-04-25 ENCOUNTER — Encounter: Payer: Self-pay | Admitting: Nurse Practitioner

## 2021-04-26 ENCOUNTER — Other Ambulatory Visit: Payer: Self-pay | Admitting: Nurse Practitioner

## 2021-04-26 DIAGNOSIS — E039 Hypothyroidism, unspecified: Secondary | ICD-10-CM

## 2021-04-27 DIAGNOSIS — E785 Hyperlipidemia, unspecified: Secondary | ICD-10-CM | POA: Diagnosis not present

## 2021-04-27 DIAGNOSIS — M8580 Other specified disorders of bone density and structure, unspecified site: Secondary | ICD-10-CM | POA: Diagnosis not present

## 2021-04-27 DIAGNOSIS — Z8249 Family history of ischemic heart disease and other diseases of the circulatory system: Secondary | ICD-10-CM | POA: Diagnosis not present

## 2021-04-27 DIAGNOSIS — I10 Essential (primary) hypertension: Secondary | ICD-10-CM | POA: Diagnosis not present

## 2021-04-27 DIAGNOSIS — E039 Hypothyroidism, unspecified: Secondary | ICD-10-CM | POA: Diagnosis not present

## 2021-04-27 DIAGNOSIS — Z7982 Long term (current) use of aspirin: Secondary | ICD-10-CM | POA: Diagnosis not present

## 2021-04-27 DIAGNOSIS — F419 Anxiety disorder, unspecified: Secondary | ICD-10-CM | POA: Diagnosis not present

## 2021-04-27 DIAGNOSIS — E669 Obesity, unspecified: Secondary | ICD-10-CM | POA: Diagnosis not present

## 2021-04-27 DIAGNOSIS — Z6830 Body mass index (BMI) 30.0-30.9, adult: Secondary | ICD-10-CM | POA: Diagnosis not present

## 2021-05-08 DIAGNOSIS — J392 Other diseases of pharynx: Secondary | ICD-10-CM | POA: Diagnosis not present

## 2021-06-17 ENCOUNTER — Other Ambulatory Visit: Payer: Self-pay | Admitting: Nurse Practitioner

## 2021-08-09 DIAGNOSIS — Z87891 Personal history of nicotine dependence: Secondary | ICD-10-CM | POA: Diagnosis not present

## 2021-08-09 DIAGNOSIS — E039 Hypothyroidism, unspecified: Secondary | ICD-10-CM | POA: Diagnosis not present

## 2021-08-09 DIAGNOSIS — Z823 Family history of stroke: Secondary | ICD-10-CM | POA: Diagnosis not present

## 2021-08-09 DIAGNOSIS — Z8249 Family history of ischemic heart disease and other diseases of the circulatory system: Secondary | ICD-10-CM | POA: Diagnosis not present

## 2021-08-09 DIAGNOSIS — E785 Hyperlipidemia, unspecified: Secondary | ICD-10-CM | POA: Diagnosis not present

## 2021-08-09 DIAGNOSIS — I10 Essential (primary) hypertension: Secondary | ICD-10-CM | POA: Diagnosis not present

## 2021-08-09 DIAGNOSIS — Z809 Family history of malignant neoplasm, unspecified: Secondary | ICD-10-CM | POA: Diagnosis not present

## 2021-08-09 DIAGNOSIS — F419 Anxiety disorder, unspecified: Secondary | ICD-10-CM | POA: Diagnosis not present

## 2021-08-09 DIAGNOSIS — Z7982 Long term (current) use of aspirin: Secondary | ICD-10-CM | POA: Diagnosis not present

## 2021-09-10 ENCOUNTER — Telehealth: Payer: Self-pay

## 2021-09-10 ENCOUNTER — Encounter: Payer: Self-pay | Admitting: Nurse Practitioner

## 2021-09-10 NOTE — Telephone Encounter (Signed)
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

## 2021-09-17 ENCOUNTER — Encounter: Payer: Self-pay | Admitting: Nurse Practitioner

## 2021-09-17 ENCOUNTER — Telehealth: Payer: Self-pay

## 2021-09-17 NOTE — Telephone Encounter (Signed)
The patient was notified that her requested letter for Tamara West duty is ready for pickup.  ?

## 2021-10-19 ENCOUNTER — Other Ambulatory Visit: Payer: Self-pay | Admitting: Nurse Practitioner

## 2021-10-19 DIAGNOSIS — E039 Hypothyroidism, unspecified: Secondary | ICD-10-CM

## 2021-10-19 DIAGNOSIS — E7800 Pure hypercholesterolemia, unspecified: Secondary | ICD-10-CM

## 2021-12-10 ENCOUNTER — Other Ambulatory Visit: Payer: Self-pay | Admitting: Nurse Practitioner

## 2021-12-11 ENCOUNTER — Ambulatory Visit (INDEPENDENT_AMBULATORY_CARE_PROVIDER_SITE_OTHER): Payer: Medicare PPO | Admitting: Nurse Practitioner

## 2021-12-11 ENCOUNTER — Encounter: Payer: Self-pay | Admitting: Nurse Practitioner

## 2021-12-11 VITALS — BP 128/68 | HR 81 | Temp 98.3°F | Ht 59.6 in | Wt 147.0 lb

## 2021-12-11 DIAGNOSIS — E039 Hypothyroidism, unspecified: Secondary | ICD-10-CM | POA: Diagnosis not present

## 2021-12-11 DIAGNOSIS — Z79899 Other long term (current) drug therapy: Secondary | ICD-10-CM | POA: Diagnosis not present

## 2021-12-11 DIAGNOSIS — E7800 Pure hypercholesterolemia, unspecified: Secondary | ICD-10-CM

## 2021-12-11 DIAGNOSIS — H6121 Impacted cerumen, right ear: Secondary | ICD-10-CM | POA: Diagnosis not present

## 2021-12-11 DIAGNOSIS — I10 Essential (primary) hypertension: Secondary | ICD-10-CM

## 2021-12-11 DIAGNOSIS — Z0000 Encounter for general adult medical examination without abnormal findings: Secondary | ICD-10-CM

## 2021-12-11 LAB — POCT URINALYSIS DIPSTICK
Bilirubin, UA: NEGATIVE
Blood, UA: NEGATIVE
Glucose, UA: NEGATIVE
Ketones, UA: NEGATIVE
Nitrite, UA: NEGATIVE
Protein, UA: NEGATIVE
Spec Grav, UA: <=1.005 — AB (ref 1.010–1.025)
Urobilinogen, UA: 0.2 E.U./dL
pH, UA: 6 (ref 5.0–8.0)

## 2021-12-11 NOTE — Progress Notes (Signed)
I,Tianna Badgett,acting as a Education administrator for [employer], FNP.,have documented all relevant documentation on the behalf of Minette Brine, FNP,as directed by  Minette Brine, FNP while in the presence of Minette Brine, [place].  This visit occurred during the SARS-CoV-2 public health emergency.  Safety protocols were in place, including screening questions prior to the visit, additional usage of staff PPE, and extensive cleaning of exam room while observing appropriate contact time as indicated for disinfecting solutions.  Subjective:     Patient ID: Tamara West , female    DOB: 08-21-1955 , 67 y.o.   MRN: [id number]   Chief Complaint  Patient presents with   Annual Exam    HPI  Here for HM.   Wt Readings from Last 3 Encounters: 12/11/21 : 147 lb (66.7 kg) 04/17/21 : 155 lb 9.6 oz (70.6 kg) 03/22/21 : 156 lb (70.8 kg)    Hypertension This is a chronic problem. The current episode started more than 1 year ago. The problem is unchanged. The problem is controlled. Pertinent negatives include no anxiety. There are no associated agents to hypertension. Risk factors for coronary artery disease include obesity and sedentary lifestyle. Past treatments include ACE inhibitors. There are no compliance problems.  There is no history of angina. Identifiable causes of hypertension include a thyroid problem.  Thyroid Problem Presents for follow-up visit. Patient reports no anxiety or fatigue. The symptoms have been resolved.     Past Medical History:  Diagnosis Date   Allergy    seasonal   Anxiety    Depression    GERD (gastroesophageal reflux disease)    Hyperlipidemia    Hypertension    Sleep apnea    mild, was not put on CPAP   Thyroid disease      Family History  Problem Relation Age of Onset   Stroke Father    Hypertension Sister    Hypertension Brother    Colon cancer Neg Hx    Breast cancer Neg Hx    Colon polyps Neg Hx    Esophageal cancer Neg Hx    Stomach cancer Neg Hx     Rectal cancer Neg Hx      Current Outpatient Medications:    ASPIRIN 81 PO, Take by mouth. Take one tablet daily, Disp: , Rfl:    benzonatate (TESSALON PERLES) 100 MG capsule, Take 1 capsule (100 mg total) by mouth 3 (three) times daily as needed for cough., Disp: 30 capsule, Rfl: 1   levothyroxine (SYNTHROID) 25 MCG tablet, TAKE 1 TABLET BY MOUTH EVERY DAY BEFORE BREAKFAST, Disp: 90 tablet, Rfl: 1   olmesartan (BENICAR) 20 MG tablet, TAKE 1 TABLET BY MOUTH EVERY DAY, Disp: 90 tablet, Rfl: 1   simvastatin (ZOCOR) 20 MG tablet, TAKE 1 TABLET BY MOUTH DAILY AT 6 PM., Disp: 90 tablet, Rfl: 1   VITAMIN D PO, Take by mouth. Take 1 tablet daily, Disp: , Rfl:    Allergies  Allergen Reactions   Penicillins Swelling   Sulfa Antibiotics Other (See Comments)    knots   Influenza Vaccines Cough    Patient states it makes her cough for weeks.      The patient states she is status post hysterectomy.  Negative for: breast discharge, breast lump(s), breast pain and breast self exam. Associated symptoms include abnormal vaginal bleeding. Pertinent negatives include abnormal bleeding (hematology), anxiety, decreased libido, depression, difficulty falling sleep, dyspareunia, history of infertility, nocturia, sexual dysfunction, sleep disturbances, urinary incontinence, urinary urgency, vaginal discharge and vaginal  itching. Diet regular.The patient states her exercise level is moderate - she is walking 1.5 hours each time 5 days a week.  The patient's tobacco use is:  Social History   Tobacco Use  Smoking Status Former   Packs/day: 0.50   Years: 3.00   Total pack years: 1.50   Types: Cigarettes   Quit date: 1989   Years since quitting: 34.4  Smokeless Tobacco Never  Tobacco Comments   Quit 1989  . She has been exposed to passive smoke. The patient's alcohol use is:  Social History   Substance and Sexual Activity  Alcohol Use No   Alcohol/week: 0.0 standard drinks of alcohol    Review of  Systems  Constitutional: Negative.  Negative for fatigue.  HENT: Negative.    Eyes: Negative.   Respiratory: Negative.    Cardiovascular: Negative.   Gastrointestinal: Negative.   Endocrine: Negative.   Genitourinary: Negative.   Musculoskeletal: Negative.   Skin: Negative.   Allergic/Immunologic: Negative.   Neurological: Negative.   Hematological: Negative.   Psychiatric/Behavioral: Negative.  The patient is not nervous/anxious.      Today's Vitals   12/11/21 1406  BP: 128/68  Pulse: 81  Temp: 98.3 F (36.8 C)  TempSrc: Oral  Weight: 147 lb (66.7 kg)  Height: 4' 11.6" (1.514 m)  PainSc: 0-No pain   Body mass index is 29.1 kg/m.  Wt Readings from Last 3 Encounters:  12/11/21 147 lb (66.7 kg)  04/17/21 155 lb 9.6 oz (70.6 kg)  03/22/21 156 lb (70.8 kg)    Objective:  Physical Exam Vitals reviewed.  Constitutional:      General: She is not in acute distress.    Appearance: Normal appearance. She is well-developed. She is obese.  HENT:     Head: Normocephalic and atraumatic.     Right Ear: Hearing, tympanic membrane, ear canal and external ear normal. There is no impacted cerumen.     Left Ear: Hearing and external ear normal. There is impacted cerumen.     Nose:     Comments: Deferred - mask    Mouth/Throat:     Comments: Deferred - mask Eyes:     General: Lids are normal.     Extraocular Movements: Extraocular movements intact.     Conjunctiva/sclera: Conjunctivae normal.     Pupils: Pupils are equal, round, and reactive to light.     Funduscopic exam:    Right eye: No papilledema.        Left eye: No papilledema.  Neck:     Thyroid: No thyroid mass.     Vascular: No carotid bruit.  Cardiovascular:     Rate and Rhythm: Normal rate and regular rhythm.     Pulses: Normal pulses.     Heart sounds: Normal heart sounds. No murmur heard. Pulmonary:     Effort: Pulmonary effort is normal. No respiratory distress.     Breath sounds: Normal breath sounds.   Chest:  Breasts:    Right: Normal. No mass or tenderness.     Left: Normal. No mass or tenderness.  Abdominal:     General: Abdomen is flat. Bowel sounds are normal. There is no distension.     Palpations: Abdomen is soft.  Musculoskeletal:        General: No swelling. Normal range of motion.     Cervical back: Full passive range of motion without pain, normal range of motion and neck supple.     Right lower leg: No edema.  Left lower leg: No edema.  Lymphadenopathy:     Upper Body:     Right upper body: No supraclavicular or axillary adenopathy.     Left upper body: No supraclavicular or axillary adenopathy.  Skin:    General: Skin is warm and dry.     Capillary Refill: Capillary refill takes less than 2 seconds.  Neurological:     General: No focal deficit present.     Mental Status: She is alert and oriented to person, place, and time.     Cranial Nerves: No cranial nerve deficit.     Sensory: No sensory deficit.  Psychiatric:        Mood and Affect: Mood normal.        Behavior: Behavior normal.        Thought Content: Thought content normal.        Judgment: Judgment normal.         Assessment And Plan:     1. Health maintenance examination Behavior modifications discussed and diet history reviewed.   Pt will continue to exercise regularly and modify diet with low GI, plant based foods and decrease intake of processed foods.  Recommend intake of daily multivitamin, Vitamin D, and calcium.  Recommend mammogram and colonoscopy for preventive screenings, as well as recommend immunizations that include TDAP, and Shingles  2. Essential (primary) hypertension Comments: Blood pressure is well controlled, will consider decreasing dose of olmesartan if remains controlled. EKG done NSR HR 68 - EKG 12-Lead - POCT Urinalysis Dipstick (81002) - Microalbumin / Creatinine Urine Ratio  3. Acquired hypothyroidism Comments: Stable, continue medications, will make changes  pending labs - TSH  4. Elevated cholesterol Comments: Stable, continue statin, tolerating well.  - CMP14+EGFR - Lipid panel  5. Other long term (current) drug therapy - CBC - TSH  6. Impacted cerumen of right ear Comments: removed cerumen with lighted curette.     Patient was given opportunity to ask questions. Patient verbalized understanding of the plan and was able to repeat key elements of the plan. All questions were answered to their satisfaction.   Minette Brine, FNP   I, Minette Brine, FNP, have reviewed all documentation for this visit. The documentation on 12/11/21 for the exam, diagnosis, procedures, and orders are all accurate and complete.    THE PATIENT IS ENCOURAGED TO PRACTICE SOCIAL DISTANCING DUE TO THE COVID-19 PANDEMIC.

## 2021-12-11 NOTE — Patient Instructions (Addendum)
Health Maintenance, Female Adopting a healthy lifestyle and getting preventive care are important in promoting health and wellness. Ask your health care provider about: The right schedule for you to have regular tests and exams. Things you can do on your own to prevent diseases and keep yourself healthy. What should I know about diet, weight, and exercise? Eat a healthy diet  Eat a diet that includes plenty of vegetables, fruits, low-fat dairy products, and lean protein. Do not eat a lot of foods that are high in solid fats, added sugars, or sodium. Maintain a healthy weight Body mass index (BMI) is used to identify weight problems. It estimates body fat based on height and weight. Your health care provider can help determine your BMI and help you achieve or maintain a healthy weight. Get regular exercise Get regular exercise. This is one of the most important things you can do for your health. Most adults should: Exercise for at least 150 minutes each week. The exercise should increase your heart rate and make you sweat (moderate-intensity exercise). Do strengthening exercises at least twice a week. This is in addition to the moderate-intensity exercise. Spend less time sitting. Even light physical activity can be beneficial. Watch cholesterol and blood lipids Have your blood tested for lipids and cholesterol at 67 years of age, then have this test every 5 years. Have your cholesterol levels checked more often if: Your lipid or cholesterol levels are high. You are older than 67 years of age. You are at high risk for heart disease. What should I know about cancer screening? Depending on your health history and family history, you may need to have cancer screening at various ages. This may include screening for: Breast cancer. Cervical cancer. Colorectal cancer. Skin cancer. Lung cancer. What should I know about heart disease, diabetes, and high blood pressure? Blood pressure and heart  disease High blood pressure causes heart disease and increases the risk of stroke. This is more likely to develop in people who have high blood pressure readings or are overweight. Have your blood pressure checked: Every 3-5 years if you are 18-39 years of age. Every year if you are 40 years old or older. Diabetes Have regular diabetes screenings. This checks your fasting blood sugar level. Have the screening done: Once every three years after age 40 if you are at a normal weight and have a low risk for diabetes. More often and at a younger age if you are overweight or have a high risk for diabetes. What should I know about preventing infection? Hepatitis B If you have a higher risk for hepatitis B, you should be screened for this virus. Talk with your health care provider to find out if you are at risk for hepatitis B infection. Hepatitis C Testing is recommended for: Everyone born from 1945 through 1965. Anyone with known risk factors for hepatitis C. Sexually transmitted infections (STIs) Get screened for STIs, including gonorrhea and chlamydia, if: You are sexually active and are younger than 67 years of age. You are older than 67 years of age and your health care provider tells you that you are at risk for this type of infection. Your sexual activity has changed since you were last screened, and you are at increased risk for chlamydia or gonorrhea. Ask your health care provider if you are at risk. Ask your health care provider about whether you are at high risk for HIV. Your health care provider may recommend a prescription medicine to help prevent HIV   infection. If you choose to take medicine to prevent HIV, you should first get tested for HIV. You should then be tested every 3 months for as long as you are taking the medicine. Pregnancy If you are about to stop having your period (premenopausal) and you may become pregnant, seek counseling before you get pregnant. Take 400 to 800  micrograms (mcg) of folic acid every day if you become pregnant. Ask for birth control (contraception) if you want to prevent pregnancy. Osteoporosis and menopause Osteoporosis is a disease in which the bones lose minerals and strength with aging. This can result in bone fractures. If you are 77 years old or older, or if you are at risk for osteoporosis and fractures, ask your health care provider if you should: Be screened for bone loss. Take a calcium or vitamin D supplement to lower your risk of fractures. Be given hormone replacement therapy (HRT) to treat symptoms of menopause. Follow these instructions at home: Alcohol use Do not drink alcohol if: Your health care provider tells you not to drink. You are pregnant, may be pregnant, or are planning to become pregnant. If you drink alcohol: Limit how much you have to: 0-1 drink a day. Know how much alcohol is in your drink. In the [place] one drink equals one 12 oz bottle of beer (355 mL), one 5 oz glass of wine (148 mL), or one 1 oz glass of hard liquor (44 mL). Lifestyle Do not use any products that contain nicotine or tobacco. These products include cigarettes, chewing tobacco, and vaping devices, such as e-cigarettes. If you need help quitting, ask your health care provider. Do not use street drugs. Do not share needles. Ask your health care provider for help if you need support or information about quitting drugs. General instructions Schedule regular health, dental, and eye exams. Stay current with your vaccines. Tell your health care provider if: You often feel depressed. You have ever been abused or do not feel safe at home. Summary Adopting a healthy lifestyle and getting preventive care are important in promoting health and wellness. Follow your health care provider's instructions about healthy diet, exercising, and getting tested or screened for diseases. Follow your health care provider's instructions on monitoring your  cholesterol and blood pressure. This information is not intended to replace advice given to you by your health care provider. Make sure you discuss any questions you have with your health care provider. Document Revised: 11/20/2020 Document Reviewed: 11/20/2020 Elsevier Patient Education  [place], Adult The ears produce a substance called earwax that helps keep bacteria out of the ear and protects the skin in the ear canal. Occasionally, earwax can build up in the ear and cause discomfort or hearing loss. What are the causes? This condition is caused by a buildup of earwax. Ear canals are self-cleaning. Ear wax is made in the outer part of the ear canal and generally falls out in small amounts over time. When the self-cleaning mechanism is not working, earwax builds up and can cause decreased hearing and discomfort. Attempting to clean ears with cotton swabs can push the earwax deep into the ear canal and cause decreased hearing and pain. What increases the risk? This condition is more likely to develop in people who: Clean their ears often with cotton swabs. Pick at their ears. Use earplugs or in-ear headphones often, or wear hearing aids. The following factors may also make you more likely to develop this condition: Being female. Being  of older age. Naturally producing more earwax. Having narrow ear canals. Having earwax that is overly thick or sticky. Having excess hair in the ear canal. Having eczema. Being dehydrated. What are the signs or symptoms? Symptoms of this condition include: Reduced or muffled hearing. A feeling of fullness in the ear or feeling that the ear is plugged. Fluid coming from the ear. Ear pain or an itchy ear. Ringing in the ear. Coughing. Balance problems. An obvious piece of earwax that can be seen inside the ear canal. How is this diagnosed? This condition may be diagnosed based on: Your symptoms. Your medical history. An  ear exam. During the exam, your health care provider will look into your ear with an instrument called an otoscope. You may have tests, including a hearing test. How is this treated? This condition may be treated by: Using ear drops to soften the earwax. Having the earwax removed by a health care provider. The health care provider may: Flush the ear with water. Use an instrument that has a loop on the end (curette). Use a suction device. Having surgery to remove the wax buildup. This may be done in severe cases. Follow these instructions at home:  Take over-the-counter and prescription medicines only as told by your health care provider. Do not put any objects, including cotton swabs, into your ear. You can clean the opening of your ear canal with a washcloth or facial tissue. Follow instructions from your health care provider about cleaning your ears. Do not overclean your ears. Drink enough fluid to keep your urine pale yellow. This will help to thin the earwax. Keep all follow-up visits as told. If earwax builds up in your ears often or if you use hearing aids, consider seeing your health care provider for routine, preventive ear cleanings. Ask your health care provider how often you should schedule your cleanings. If you have hearing aids, clean them according to instructions from the manufacturer and your health care provider. Contact a health care provider if: You have ear pain. You develop a fever. You have pus or other fluid coming from your ear. You have hearing loss. You have ringing in your ears that does not go away. You feel like the room is spinning (vertigo). Your symptoms do not improve with treatment. Get help right away if: You have bleeding from the affected ear. You have severe ear pain. Summary Earwax can build up in the ear and cause discomfort or hearing loss. The most common symptoms of this condition include reduced or muffled hearing, a feeling of fullness in  the ear, or feeling that the ear is plugged. This condition may be diagnosed based on your symptoms, your medical history, and an ear exam. This condition may be treated by using ear drops to soften the earwax or by having the earwax removed by a health care provider. Do not put any objects, including cotton swabs, into your ear. You can clean the opening of your ear canal with a washcloth or facial tissue. This information is not intended to replace advice given to you by your health care provider. Make sure you discuss any questions you have with your health care provider. Document Revised: 10/19/2019 Document Reviewed: 10/19/2019 Elsevier Patient Education  [place] can use 1/2 water and 1/2 peroxide solution to your ear or use Debrox drops

## 2021-12-12 LAB — CMP14+EGFR
ALT: 18 IU/L (ref 0–32)
AST: 30 IU/L (ref 0–40)
Albumin/Globulin Ratio: 1.6 (ref 1.2–2.2)
Albumin: 4.5 g/dL (ref 3.8–4.8)
Alkaline Phosphatase: 90 IU/L (ref 44–121)
BUN/Creatinine Ratio: 13 (ref 12–28)
BUN: 12 mg/dL (ref 8–27)
Bilirubin Total: 0.2 mg/dL (ref 0.0–1.2)
CO2: 23 mmol/L (ref 20–29)
Calcium: 10.1 mg/dL (ref 8.7–10.3)
Chloride: 104 mmol/L (ref 96–106)
Creatinine, Ser: 0.89 mg/dL (ref 0.57–1.00)
Globulin, Total: 2.8 g/dL (ref 1.5–4.5)
Glucose: 86 mg/dL (ref 70–99)
Potassium: 4.7 mmol/L (ref 3.5–5.2)
Sodium: 143 mmol/L (ref 134–144)
Total Protein: 7.3 g/dL (ref 6.0–8.5)
eGFR: 71 mL/min/{1.73_m2} (ref >59)

## 2021-12-12 LAB — CBC
Hematocrit: 34.6 % (ref 34.0–46.6)
Hemoglobin: 11.3 g/dL (ref 11.1–15.9)
MCH: 29.2 pg (ref 26.6–33.0)
MCHC: 32.7 g/dL (ref 31.5–35.7)
MCV: 89 fL (ref 79–97)
Platelets: 229 10*3/uL (ref 150–450)
RBC: 3.87 x10E6/uL (ref 3.77–5.28)
RDW: 12.5 % (ref 11.7–15.4)
WBC: 6.6 10*3/uL (ref 3.4–10.8)

## 2021-12-12 LAB — LIPID PANEL
Chol/HDL Ratio: 3.1 ratio (ref 0.0–4.4)
Cholesterol, Total: 175 mg/dL (ref 100–199)
HDL: 57 mg/dL (ref >39)
LDL Chol Calc (NIH): 104 mg/dL — ABNORMAL HIGH (ref 0–99)
Triglycerides: 72 mg/dL (ref 0–149)
VLDL Cholesterol Cal: 14 mg/dL (ref 5–40)

## 2021-12-12 LAB — MICROALBUMIN / CREATININE URINE RATIO
Creatinine, Urine: 10.6 mg/dL
Microalb/Creat Ratio: <28 mg/g creat (ref 0–29)
Microalbumin, Urine: <3 ug/mL

## 2021-12-12 LAB — TSH: TSH: 3.2 u[IU]/mL (ref 0.450–4.500)

## 2021-12-13 ENCOUNTER — Encounter: Payer: Self-pay | Admitting: Nurse Practitioner

## 2022-03-05 ENCOUNTER — Other Ambulatory Visit: Payer: Self-pay | Admitting: Internal Medicine

## 2022-03-05 DIAGNOSIS — Z1231 Encounter for screening mammogram for malignant neoplasm of breast: Secondary | ICD-10-CM

## 2022-04-09 ENCOUNTER — Ambulatory Visit
Admission: RE | Admit: 2022-04-09 | Discharge: 2022-04-09 | Disposition: A | Discharge status: Home / Self Care | Payer: Medicare PPO | Source: Ambulatory Visit | Attending: Internal Medicine | Admitting: Internal Medicine

## 2022-04-09 DIAGNOSIS — Z1231 Encounter for screening mammogram for malignant neoplasm of breast: Secondary | ICD-10-CM | POA: Diagnosis not present

## 2022-04-13 ENCOUNTER — Other Ambulatory Visit: Payer: Self-pay | Admitting: Nurse Practitioner

## 2022-04-13 DIAGNOSIS — E039 Hypothyroidism, unspecified: Secondary | ICD-10-CM

## 2022-04-13 DIAGNOSIS — E7800 Pure hypercholesterolemia, unspecified: Secondary | ICD-10-CM

## 2022-04-18 ENCOUNTER — Ambulatory Visit: Payer: Medicare PPO

## 2022-04-24 ENCOUNTER — Ambulatory Visit (INDEPENDENT_AMBULATORY_CARE_PROVIDER_SITE_OTHER): Payer: Medicare PPO

## 2022-04-24 VITALS — BP 130/70 | HR 68 | Temp 97.9°F | Ht 59.4 in | Wt 144.4 lb

## 2022-04-24 DIAGNOSIS — Z0000 Encounter for general adult medical examination without abnormal findings: Secondary | ICD-10-CM | POA: Diagnosis not present

## 2022-04-24 NOTE — Patient Instructions (Signed)
Tamara West , Thank you for taking time to come for your Medicare Wellness Visit. I appreciate your ongoing commitment to your health goals. Please review the following plan we discussed and let me know if I can assist you in the future.   Screening recommendations/referrals: Colonoscopy: completed 10/03/2020, due 10/04/2027 Mammogram: completed 04/09/2022, due 04/11/2023 Bone Density: completed 01/04/2021 Recommended yearly ophthalmology/optometry visit for glaucoma screening and checkup Recommended yearly dental visit for hygiene and checkup  Vaccinations: Influenza vaccine: n/a Pneumococcal vaccine: completed 12/18/2020 Tdap vaccine: completed 01/28/2014, due 01/29/2024 Shingles vaccine: decline   Covid-19: 07/09/2020, 12/07/2019, 11/16/2019  Advanced directives: Advance directive discussed with you today. Even though you declined this today please call our office should you change your mind and we can give you the proper paperwork for you to fill out.  Conditions/risks identified: none  Next appointment: Follow up in one year for your annual wellness visit    Preventive Care 65 Years and Older, Female Preventive care refers to lifestyle choices and visits with your health care provider that can promote health and wellness. What does preventive care include? A yearly physical exam. This is also called an annual well check. Dental exams once or twice a year. Routine eye exams. Ask your health care provider how often you should have your eyes checked. Personal lifestyle choices, including: Daily care of your teeth and gums. Regular physical activity. Eating a healthy diet. Avoiding tobacco and drug use. Limiting alcohol use. Practicing safe sex. Taking low-dose aspirin every day. Taking vitamin and mineral supplements as recommended by your health care provider. What happens during an annual well check? The services and screenings done by your health care provider during your annual well  check will depend on your age, overall health, lifestyle risk factors, and family history of disease. Counseling  Your health care provider may ask you questions about your: Alcohol use. Tobacco use. Drug use. Emotional well-being. Home and relationship well-being. Sexual activity. Eating habits. History of falls. Memory and ability to understand (cognition). Work and work Statistician. Reproductive health. Screening  You may have the following tests or measurements: Height, weight, and BMI. Blood pressure. Lipid and cholesterol levels. These may be checked every 5 years, or more frequently if you are over 4 years old. Skin check. Lung cancer screening. You may have this screening every year starting at age 37 if you have a 30-pack-year history of smoking and currently smoke or have quit within the past 15 years. Fecal occult blood test (FOBT) of the stool. You may have this test every year starting at age 83. Flexible sigmoidoscopy or colonoscopy. You may have a sigmoidoscopy every 5 years or a colonoscopy every 10 years starting at age 17. Hepatitis C blood test. Hepatitis B blood test. Sexually transmitted disease (STD) testing. Diabetes screening. This is done by checking your blood sugar (glucose) after you have not eaten for a while (fasting). You may have this done every 1-3 years. Bone density scan. This is done to screen for osteoporosis. You may have this done starting at age 71. Mammogram. This may be done every 1-2 years. Talk to your health care provider about how often you should have regular mammograms. Talk with your health care provider about your test results, treatment options, and if necessary, the need for more tests. Vaccines  Your health care provider may recommend certain vaccines, such as: Influenza vaccine. This is recommended every year. Tetanus, diphtheria, and acellular pertussis (Tdap, Td) vaccine. You may need a Td booster  every 10 years. Zoster  vaccine. You may need this after age 67. Pneumococcal 13-valent conjugate (PCV13) vaccine. One dose is recommended after age 63. Pneumococcal polysaccharide (PPSV23) vaccine. One dose is recommended after age 58. Talk to your health care provider about which screenings and vaccines you need and how often you need them. This information is not intended to replace advice given to you by your health care provider. Make sure you discuss any questions you have with your health care provider. Document Released: 07/28/2015 Document Revised: 03/20/2016 Document Reviewed: 05/02/2015 Elsevier Interactive Patient Education  2017 [place] Prevention in the Home Falls can cause injuries. They can happen to people of all ages. There are many things you can do to make your home safe and to help prevent falls. What can I do on the outside of my home? Regularly fix the edges of walkways and driveways and fix any cracks. Remove anything that might make you trip as you walk through a door, such as a raised step or threshold. Trim any bushes or trees on the path to your home. Use bright outdoor lighting. Clear any walking paths of anything that might make someone trip, such as rocks or tools. Regularly check to see if handrails are loose or broken. Make sure that both sides of any steps have handrails. Any raised decks and porches should have guardrails on the edges. Have any leaves, snow, or ice cleared regularly. Use sand or salt on walking paths during winter. Clean up any spills in your garage right away. This includes oil or grease spills. What can I do in the bathroom? Use night lights. Install grab bars by the toilet and in the tub and shower. Do not use towel bars as grab bars. Use non-skid mats or decals in the tub or shower. If you need to sit down in the shower, use a plastic, non-slip stool. Keep the floor dry. Clean up any water that spills on the floor as soon as it happens. Remove  soap buildup in the tub or shower regularly. Attach bath mats securely with double-sided non-slip rug tape. Do not have throw rugs and other things on the floor that can make you trip. What can I do in the bedroom? Use night lights. Make sure that you have a light by your bed that is easy to reach. Do not use any sheets or blankets that are too big for your bed. They should not hang down onto the floor. Have a firm chair that has side arms. You can use this for support while you get dressed. Do not have throw rugs and other things on the floor that can make you trip. What can I do in the kitchen? Clean up any spills right away. Avoid walking on wet floors. Keep items that you use a lot in easy-to-reach places. If you need to reach something above you, use a strong step stool that has a grab bar. Keep electrical cords out of the way. Do not use floor polish or wax that makes floors slippery. If you must use wax, use non-skid floor wax. Do not have throw rugs and other things on the floor that can make you trip. What can I do with my stairs? Do not leave any items on the stairs. Make sure that there are handrails on both sides of the stairs and use them. Fix handrails that are broken or loose. Make sure that handrails are as long as the stairways. Check any carpeting to  make sure that it is firmly attached to the stairs. Fix any carpet that is loose or worn. Avoid having throw rugs at the top or bottom of the stairs. If you do have throw rugs, attach them to the floor with carpet tape. Make sure that you have a light switch at the top of the stairs and the bottom of the stairs. If you do not have them, ask someone to add them for you. What else can I do to help prevent falls? Wear shoes that: Do not have high heels. Have rubber bottoms. Are comfortable and fit you well. Are closed at the toe. Do not wear sandals. If you use a stepladder: Make sure that it is fully opened. Do not climb a  closed stepladder. Make sure that both sides of the stepladder are locked into place. Ask someone to hold it for you, if possible. Clearly mark and make sure that you can see: Any grab bars or handrails. First and last steps. Where the edge of each step is. Use tools that help you move around (mobility aids) if they are needed. These include: Canes. Walkers. Scooters. Crutches. Turn on the lights when you go into a dark area. Replace any light bulbs as soon as they burn out. Set up your furniture so you have a clear path. Avoid moving your furniture around. If any of your floors are uneven, fix them. If there are any pets around you, be aware of where they are. Review your medicines with your doctor. Some medicines can make you feel dizzy. This can increase your chance of falling. Ask your doctor what other things that you can do to help prevent falls. This information is not intended to replace advice given to you by your health care provider. Make sure you discuss any questions you have with your health care provider. Document Released: 04/27/2009 Document Revised: 12/07/2015 Document Reviewed: 08/05/2014 Elsevier Interactive Patient Education  2017 [employer].

## 2022-04-24 NOTE — Progress Notes (Signed)
Subjective:   Tamara West is a 66 y.o. female who presents for Medicare Annual (Subsequent) preventive examination.  Review of Systems     Cardiac Risk Factors include: advanced age (>82mn, >>67women);hypertension     Objective:    Today's Vitals   04/24/22 0813  BP: 130/70  Pulse: 68  Temp: 97.9 F (36.6 C)  TempSrc: Oral  SpO2: 99%  Weight: 144 lb 6.4 oz (65.5 kg)  Height: 4' 11.4" (1.509 m)   Body mass index is 28.77 kg/m.     04/24/2022    8:23 AM 04/17/2021   11:57 AM 04/17/2021   11:22 AM 03/22/2015    2:21 PM  Advanced Directives  Does Patient Have a Medical Advance Directive? No  No No  Would patient like information on creating a medical advance directive? No - Patient declined Yes (MAU/Ambulatory/Procedural Areas - Information given) Yes (ED - Information included in AVS)     Current Medications (verified) Outpatient Encounter Medications as of 04/24/2022  Medication Sig   ASPIRIN 81 PO Take by mouth. Take one tablet daily   levothyroxine (SYNTHROID) 25 MCG tablet TAKE 1 TABLET BY MOUTH EVERY DAY BEFORE BREAKFAST   olmesartan (BENICAR) 20 MG tablet TAKE 1 TABLET BY MOUTH EVERY DAY   simvastatin (ZOCOR) 20 MG tablet TAKE 1 TABLET BY MOUTH DAILY AT 6 PM.   VITAMIN D PO Take by mouth. Take 1 tablet daily   No facility-administered encounter medications on file as of 04/24/2022.    Allergies (verified) Penicillins, Sulfa antibiotics, and Influenza vaccines   History: Past Medical History:  Diagnosis Date   Allergy    seasonal   Anxiety    Depression    GERD (gastroesophageal reflux disease)    Hyperlipidemia    Hypertension    Sleep apnea    mild, was not put on CPAP   Thyroid disease    Past Surgical History:  Procedure Laterality Date   ABDOMINAL HYSTERECTOMY     COLONOSCOPY  2004   w/Dr.Mann=hemorrhoid    POLYPECTOMY     Family History  Problem Relation Age of Onset   Stroke Father    Hypertension Sister    Hypertension Brother     Colon cancer Neg Hx    Breast cancer Neg Hx    Colon polyps Neg Hx    Esophageal cancer Neg Hx    Stomach cancer Neg Hx    Rectal cancer Neg Hx    Social History   Socioeconomic History   Marital status: Widowed    Spouse name: Not on file   Number of children: 4   Years of education: 11   Highest education level: Not on file  Occupational History   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[employer]  Tobacco Use   Smoking status: Former    Packs/day: 0.50    Years: 3.00    Total pack years: 1.50    Types: Cigarettes    Quit date: 1989    Years since quitting: 34.7   Smokeless tobacco: Never   Tobacco comments:    Quit 1989  Vaping Use   Vaping Use: Never used  Substance and Sexual Activity   Alcohol use: No    Alcohol/week: 0.0 standard drinks of alcohol   Drug use: No   Sexual activity: Not Currently  Other Topics Concern   Not on file  Social History Narrative   Occasionally drinks soda   Social Determinants of Health   Financial  Resource Strain: Low Risk  (04/24/2022)   Overall Financial Resource Strain (CARDIA)    Difficulty of Paying Living Expenses: Not hard at all  Food Insecurity: No Food Insecurity (04/24/2022)   Hunger Vital Sign    Worried About Running Out of Food in the Last Year: Never true    Ran Out of Food in the Last Year: Never true  Transportation Needs: No Transportation Needs (04/24/2022)   PRAPARE - Hydrologist (Medical): No    Lack of Transportation (Non-Medical): No  Physical Activity: Sufficiently Active (04/24/2022)   Exercise Vital Sign    Days of Exercise per Week: 5 days    Minutes of Exercise per Session: 60 min  Stress: No Stress Concern Present (04/24/2022)   Sunshine    Feeling of Stress : Not at all  Social Connections: Moderately Isolated (04/17/2021)   Social Connection and Isolation Panel [NHANES]    Frequency of  Communication with Friends and Family: More than three times a week    Frequency of Social Gatherings with Friends and Family: Once a week    Attends Religious Services: More than 4 times per year    Active Member of [employer] or Organizations: No    Attends Archivist Meetings: Not on file    Marital Status: Widowed    Tobacco Counseling Counseling given: Not Answered Tobacco comments: Quit 1989   Clinical Intake:  Pre-visit preparation completed: Yes  Pain : No/denies pain     Nutritional Status: BMI 25 -29 Overweight Nutritional Risks: None Diabetes: No  How often do you need to have someone help you when you read instructions, pamphlets, or other written materials from your doctor or pharmacy?: 1 - Never What is the last grade level you completed in school?: 10th grade  Diabetic? no  Interpreter Needed?: No  Information entered by :: NAllen LPN   Activities of Daily Living    04/24/2022    8:23 AM  In your present state of health, do you have any difficulty performing the following activities:  Hearing? 0  Vision? 0  Difficulty concentrating or making decisions? 0  Walking or climbing stairs? 0  Dressing or bathing? 0  Doing errands, shopping? 0  Preparing Food and eating ? N  Using the Toilet? N  In the past six months, have you accidently leaked urine? N  Do you have problems with loss of bowel control? N  Managing your Medications? N  Managing your Finances? N  Housekeeping or managing your Housekeeping? N    Patient Care Team: Minette Brine, FNP as PCP - General (General Practice) Suella Broad, FNP as Nurse Practitioner (Psychiatry)  Indicate any recent Medical Services you may have received from other than [facility] providers in the past year (date may be approximate).     Assessment:   This is a routine wellness examination for Lova.  Hearing/Vision screen Vision Screening - Comments:: Regular eye exams, [facility]  Dietary  issues and exercise activities discussed: Current Exercise Habits: Home exercise routine, Type of exercise: walking, Time (Minutes): 60, Frequency (Times/Week): 5, Weekly Exercise (Minutes/Week): 300   Goals Addressed             This Visit's Progress    Patient Stated       04/24/2022, maintain current health       Depression Screen    04/24/2022    8:23 AM 04/17/2021  11:23 AM 12/06/2020    2:46 PM 09/16/2019   10:21 AM 03/15/2019    9:27 AM 11/17/2018   11:44 AM 08/06/2018    9:22 AM  PHQ 2/9 Scores  PHQ - 2 Score 0 0 0 0 0 0 0  PHQ- 9 Score   0        Fall Risk    04/24/2022    8:23 AM 04/17/2021   11:23 AM 12/06/2020    2:47 PM 09/16/2019   10:21 AM 03/15/2019    9:27 AM  Fall Risk   Falls in the past year? 0 0 0 0 0  Number falls in past yr: 0 0     Injury with Fall? 0 0     Risk for fall due to : Medication side effect      Follow up Falls prevention discussed;Education provided;Falls evaluation completed        FALL RISK PREVENTION PERTAINING TO THE HOME:  Any stairs in or around the home? Yes  If so, are there any without handrails? No  Home free of loose throw rugs in walkways, pet beds, electrical cords, etc? Yes  Adequate lighting in your home to reduce risk of falls? Yes   ASSISTIVE DEVICES UTILIZED TO PREVENT FALLS:  Life alert? No  Use of a cane, walker or w/c? No  Grab bars in the bathroom? No  Shower chair or bench in shower? No  Elevated toilet seat or a handicapped toilet? No   TIMED UP AND GO:  Was the test performed? Yes .  Length of time to ambulate 10 feet: 5 sec.   Gait steady and fast without use of assistive device  Cognitive Function:        04/24/2022    8:24 AM 04/17/2021   11:26 AM  6CIT Screen  What Year? 0 points 0 points  What month? 0 points 0 points  What time? 0 points 0 points  Count back from 20 0 points 0 points  Months in reverse 0 points 0 points  Repeat phrase 0 points 0 points  Total Score 0 points 0  points    Immunizations Immunization History  Administered Date(s) Administered   PFIZER(Purple Top)SARS-COV-2 Vaccination 11/16/2019, 12/07/2019, 07/09/2020   PNEUMOCOCCAL CONJUGATE-20 12/18/2020    TDAP status: Up to date  Flu Vaccine status: Declined, Education has been provided regarding the importance of this vaccine but patient still declined. Advised may receive this vaccine at local pharmacy or Health Dept. Aware to provide a copy of the vaccination record if obtained from local pharmacy or Health Dept. Verbalized acceptance and understanding.  Pneumococcal vaccine status: Up to date  Covid-19 vaccine status: Completed vaccines  Qualifies for Shingles Vaccine? Yes   Zostavax completed No   Shingrix Completed?: No.    Education has been provided regarding the importance of this vaccine. Patient has been advised to call insurance company to determine out of pocket expense if they have not yet received this vaccine. Advised may also receive vaccine at local pharmacy or Health Dept. Verbalized acceptance and understanding.  Screening Tests Health Maintenance  Topic Date Due   COVID-19 Vaccine (4 - Pfizer risk series) 09/03/2020   Zoster Vaccines- Shingrix (1 of 2) 07/25/2022 (Originally 06/08/1975)   INFLUENZA VACCINE  10/13/2022 (Originally 02/12/2022)   TETANUS/TDAP  01/29/2024   MAMMOGRAM  04/09/2024   COLONOSCOPY (Pts 45-32yr Insurance coverage will need to be confirmed)  10/04/2027   Pneumonia Vaccine 67 Years old  Completed  DEXA SCAN  Completed   Hepatitis C Screening  Completed   HPV VACCINES  Aged Out    Health Maintenance  Health Maintenance Due  Topic Date Due   COVID-19 Vaccine (4 - Pfizer risk series) 09/03/2020    Colorectal cancer screening: Type of screening: Colonoscopy. Completed 10/03/2020. Repeat every 7 years  Mammogram status: Completed 04/09/2022. Repeat every year  Bone Density status: Completed 01/04/2021.   Lung Cancer Screening: (Low Dose  CT Chest recommended if Age 75-80 years, 30 pack-year currently smoking OR have quit w/in 15years.) does not qualify.   Lung Cancer Screening Referral: no  Additional Screening:  Hepatitis C Screening: does qualify; Completed 08/06/2018  Vision Screening: Recommended annual ophthalmology exams for early detection of glaucoma and other disorders of the eye. Is the patient up to date with their annual eye exam?  Yes  Who is the provider or what is the name of the office in which the patient attends annual eye exams? [facility] If pt is not established with a provider, would they like to be referred to a provider to establish care? No .   Dental Screening: Recommended annual dental exams for proper oral hygiene  Community Resource Referral / Chronic Care Management: CRR required this visit?  No   CCM required this visit?  No      Plan:     I have personally reviewed and noted the following in the patient's chart:   Medical and social history Use of alcohol, tobacco or illicit drugs  Current medications and supplements including opioid prescriptions. Patient is not currently taking opioid prescriptions. Functional ability and status Nutritional status Physical activity Advanced directives List of other physicians Hospitalizations, surgeries, and ER visits in previous 12 months Vitals Screenings to include cognitive, depression, and falls Referrals and appointments  In addition, I have reviewed and discussed with patient certain preventive protocols, quality metrics, and best practice recommendations. A written personalized care plan for preventive services as well as general preventive health recommendations were provided to patient.     Kellie Simmering, LPN   [id number]   Nurse Notes: none

## 2022-06-07 ENCOUNTER — Other Ambulatory Visit: Payer: Self-pay | Admitting: Nurse Practitioner

## 2022-06-13 ENCOUNTER — Ambulatory Visit: Payer: Medicare PPO | Admitting: Nurse Practitioner

## 2022-06-24 ENCOUNTER — Encounter: Payer: Self-pay | Admitting: Nurse Practitioner

## 2022-06-24 ENCOUNTER — Ambulatory Visit: Payer: Medicare PPO | Admitting: Nurse Practitioner

## 2022-06-24 VITALS — BP 132/80 | HR 66 | Temp 98.5°F | Ht 59.4 in | Wt 140.0 lb

## 2022-06-24 DIAGNOSIS — I10 Essential (primary) hypertension: Secondary | ICD-10-CM

## 2022-06-24 DIAGNOSIS — E7800 Pure hypercholesterolemia, unspecified: Secondary | ICD-10-CM | POA: Diagnosis not present

## 2022-06-24 DIAGNOSIS — Z79899 Other long term (current) drug therapy: Secondary | ICD-10-CM

## 2022-06-24 DIAGNOSIS — R5383 Other fatigue: Secondary | ICD-10-CM | POA: Diagnosis not present

## 2022-06-24 DIAGNOSIS — E039 Hypothyroidism, unspecified: Secondary | ICD-10-CM

## 2022-06-24 NOTE — Progress Notes (Signed)
I,Tianna Badgett,acting as a Education administrator for [employer], FNP.,have documented all relevant documentation on the behalf of Minette Brine, FNP,as directed by  Minette Brine, FNP while in the presence of Minette Brine, [place].  Subjective:     Patient ID: Tamara West , female    DOB: August 21, 1955 , 66 y.o.   MRN: [id number]   Chief Complaint  Patient presents with   Hypertension    HPI  Patient is here for a f/u on her blood pressure. One of her brothers passed before Thanksgiving.  Wt Readings from Last 3 Encounters: 06/24/22 : 140 lb (63.5 kg) 04/24/22 : 144 lb 6.4 oz (65.5 kg) 12/11/21 : 147 lb (66.7 kg)    Hypertension This is a chronic problem. The current episode started more than 1 year ago. The problem is unchanged (slightly elevated today reports was 140/80. she has not taken her blood pressure medications yet). The problem is controlled. Pertinent negatives include no anxiety, chest pain, headaches or palpitations. There are no associated agents to hypertension. Risk factors for coronary artery disease include sedentary lifestyle and obesity. Past treatments include angiotensin blockers. The current treatment provides significant improvement. There are no compliance problems.  There is no history of angina. Identifiable causes of hypertension include a thyroid problem. There is no history of chronic renal disease.  Thyroid Problem Presents for follow-up visit. Patient reports no anxiety, fatigue, palpitations or weight gain. The symptoms have been stable.     Past Medical History:  Diagnosis Date   Allergy    seasonal   Anxiety    Depression    GERD (gastroesophageal reflux disease)    Hyperlipidemia    Hypertension    Sleep apnea    mild, was not put on CPAP   Thyroid disease      Family History  Problem Relation Age of Onset   Stroke Father    Hypertension Sister    Hypertension Brother    Colon cancer Neg Hx    Breast cancer Neg Hx    Colon polyps Neg Hx     Esophageal cancer Neg Hx    Stomach cancer Neg Hx    Rectal cancer Neg Hx      Current Outpatient Medications:    ASPIRIN 81 PO, Take by mouth. Take one tablet daily, Disp: , Rfl:    levothyroxine (SYNTHROID) 25 MCG tablet, TAKE 1 TABLET BY MOUTH EVERY DAY BEFORE BREAKFAST, Disp: 90 tablet, Rfl: 1   olmesartan (BENICAR) 20 MG tablet, TAKE 1 TABLET BY MOUTH EVERY DAY, Disp: 90 tablet, Rfl: 1   simvastatin (ZOCOR) 20 MG tablet, TAKE 1 TABLET BY MOUTH DAILY AT 6 PM., Disp: 90 tablet, Rfl: 1   VITAMIN D PO, Take by mouth. Take 1 tablet daily, Disp: , Rfl:    Allergies  Allergen Reactions   Penicillins Swelling   Sulfa Antibiotics Other (See Comments)    knots   Influenza Vaccines Cough    Patient states it makes her cough for weeks.     Review of Systems  Constitutional: Negative.  Negative for fatigue and weight gain.  Respiratory: Negative.    Cardiovascular: Negative.  Negative for chest pain and palpitations.  Gastrointestinal: Negative.   Neurological: Negative.  Negative for headaches.  Psychiatric/Behavioral: Negative.  The patient is not nervous/anxious.      Today's Vitals   06/24/22 1027  BP: 132/80  Pulse: 66  Temp: 98.5 F (36.9 C)  TempSrc: Oral  Weight: 140 lb (63.5 kg)  Height:  4' 11.4" (1.509 m)   Body mass index is 27.9 kg/m.  Wt Readings from Last 3 Encounters:  06/24/22 140 lb (63.5 kg)  04/24/22 144 lb 6.4 oz (65.5 kg)  12/11/21 147 lb (66.7 kg)    Objective:  Physical Exam Vitals reviewed.  Constitutional:      General: She is not in acute distress.    Appearance: Normal appearance. She is well-developed. She is obese.  HENT:     Head: Normocephalic and atraumatic.  Eyes:     Pupils: Pupils are equal, round, and reactive to light.  Cardiovascular:     Rate and Rhythm: Normal rate and regular rhythm.     Pulses: Normal pulses.     Heart sounds: Normal heart sounds. No murmur heard. Pulmonary:     Effort: Pulmonary effort is normal. No  respiratory distress.     Breath sounds: Normal breath sounds. No wheezing.  Musculoskeletal:        General: Normal range of motion.  Skin:    General: Skin is warm and dry.     Capillary Refill: Capillary refill takes less than 2 seconds.  Neurological:     General: No focal deficit present.     Mental Status: She is alert and oriented to person, place, and time.     Cranial Nerves: No cranial nerve deficit.     Motor: No weakness.  Psychiatric:        Mood and Affect: Mood normal.        Behavior: Behavior normal.        Thought Content: Thought content normal.        Judgment: Judgment normal.         Assessment And Plan:     1. Essential (primary) hypertension Comments: Blood pressure is controlled, continue current medications - BMP8+EGFR  2. Acquired hypothyroidism Comments: Stable, continue current medications pending - Thyroid Panel With TSH  3. Elevated cholesterol Comments: Cholesterol levels are stable, continue statin, tolerating well. - Lipid panel  4. Other fatigue Comments: Will check metabolic causes to include iron levels. - Iron, TIBC and Ferritin Panel - Vitamin B12  5. Other long term (current) drug therapy - BMP8+EGFR     Patient was given opportunity to ask questions. Patient verbalized understanding of the plan and was able to repeat key elements of the plan. All questions were answered to their satisfaction.  Minette Brine, FNP    I, Minette Brine, FNP, have reviewed all documentation for this visit. The documentation on 06/24/22 for the exam, diagnosis, procedures, and orders are all accurate and complete.  IF YOU HAVE BEEN REFERRED TO A SPECIALIST, IT MAY TAKE 1-2 WEEKS TO SCHEDULE/PROCESS THE REFERRAL. IF YOU HAVE NOT HEARD FROM US/SPECIALIST IN TWO WEEKS, PLEASE GIVE [place] A CALL AT [phone].   THE PATIENT IS ENCOURAGED TO PRACTICE SOCIAL DISTANCING DUE TO THE COVID-19 PANDEMIC.

## 2022-06-24 NOTE — Patient Instructions (Addendum)
Hypertension, Adult High blood pressure (hypertension) is when the force of blood pumping through the arteries is too strong. The arteries are the blood vessels that carry blood from the heart throughout the body. Hypertension forces the heart to work harder to pump blood and may cause arteries to become narrow or stiff. Untreated or uncontrolled hypertension can lead to a heart attack, heart failure, a stroke, kidney disease, and other problems. A blood pressure reading consists of a higher number over a lower number. Ideally, your blood pressure should be below 120/80. The first ("top") number is called the systolic pressure. It is a measure of the pressure in your arteries as your heart beats. The second ("bottom") number is called the diastolic pressure. It is a measure of the pressure in your arteries as the heart relaxes. What are the causes? The exact cause of this condition is not known. There are some conditions that result in high blood pressure. What increases the risk? Certain factors may make you more likely to develop high blood pressure. Some of these risk factors are under your control, including: Smoking. Not getting enough exercise or physical activity. Being overweight. Having too much fat, sugar, calories, or salt (sodium) in your diet. Drinking too much alcohol. Other risk factors include: Having a personal history of heart disease, diabetes, high cholesterol, or kidney disease. Stress. Having a family history of high blood pressure and high cholesterol. Having obstructive sleep apnea. Age. The risk increases with age. What are the signs or symptoms? High blood pressure may not cause symptoms. Very high blood pressure (hypertensive crisis) may cause: Headache. Fast or irregular heartbeats (palpitations). Shortness of breath. Nosebleed. Nausea and vomiting. Vision changes. Severe chest pain, dizziness, and seizures. How is this diagnosed? This condition is diagnosed by  measuring your blood pressure while you are seated, with your arm resting on a flat surface, your legs uncrossed, and your feet flat on the floor. The cuff of the blood pressure monitor will be placed directly against the skin of your upper arm at the level of your heart. Blood pressure should be measured at least twice using the same arm. Certain conditions can cause a difference in blood pressure between your right and left arms. If you have a high blood pressure reading during one visit or you have normal blood pressure with other risk factors, you may be asked to: Return on a different day to have your blood pressure checked again. Monitor your blood pressure at home for 1 week or longer. If you are diagnosed with hypertension, you may have other blood or imaging tests to help your health care provider understand your overall risk for other conditions. How is this treated? This condition is treated by making healthy lifestyle changes, such as eating healthy foods, exercising more, and reducing your alcohol intake. You may be referred for counseling on a healthy diet and physical activity. Your health care provider may prescribe medicine if lifestyle changes are not enough to get your blood pressure under control and if: Your systolic blood pressure is above 130. Your diastolic blood pressure is above 80. Your personal target blood pressure may vary depending on your medical conditions, your age, and other factors. Follow these instructions at home: Eating and drinking  Eat a diet that is high in fiber and potassium, and low in sodium, added sugar, and fat. An example of this eating plan is called the DASH diet. DASH stands for Dietary Approaches to Stop Hypertension. To eat this way: Eat   plenty of fresh fruits and vegetables. Try to fill one half of your plate at each meal with fruits and vegetables. Eat whole grains, such as whole-wheat pasta, brown rice, or whole-grain bread. Fill about one  fourth of your plate with whole grains. Eat or drink low-fat dairy products, such as skim milk or low-fat yogurt. Avoid fatty cuts of meat, processed or cured meats, and poultry with skin. Fill about one fourth of your plate with lean proteins, such as fish, chicken without skin, beans, eggs, or tofu. Avoid pre-made and processed foods. These tend to be higher in sodium, added sugar, and fat. Reduce your daily sodium intake. Many people with hypertension should eat less than 1,500 mg of sodium a day. Do not drink alcohol if: Your health care provider tells you not to drink. You are pregnant, may be pregnant, or are planning to become pregnant. If you drink alcohol: Limit how much you have to: 0-1 drink a day for women. 0-2 drinks a day for men. Know how much alcohol is in your drink. In the [place] one drink equals one 12 oz bottle of beer (355 mL), one 5 oz glass of wine (148 mL), or one 1 oz glass of hard liquor (44 mL). Lifestyle  Work with your health care provider to maintain a healthy body weight or to lose weight. Ask what an ideal weight is for you. Get at least 30 minutes of exercise that causes your heart to beat faster (aerobic exercise) most days of the week. Activities may include walking, swimming, or biking. Include exercise to strengthen your muscles (resistance exercise), such as Pilates or lifting weights, as part of your weekly exercise routine. Try to do these types of exercises for 30 minutes at least 3 days a week. Do not use any products that contain nicotine or tobacco. These products include cigarettes, chewing tobacco, and vaping devices, such as e-cigarettes. If you need help quitting, ask your health care provider. Monitor your blood pressure at home as told by your health care provider. Keep all follow-up visits. This is important. Medicines Take over-the-counter and prescription medicines only as told by your health care provider. Follow directions carefully. Blood  pressure medicines must be taken as prescribed. Do not skip doses of blood pressure medicine. Doing this puts you at risk for problems and can make the medicine less effective. Ask your health care provider about side effects or reactions to medicines that you should watch for. Contact a health care provider if you: Think you are having a reaction to a medicine you are taking. Have headaches that keep coming back (recurring). Feel dizzy. Have swelling in your ankles. Have trouble with your vision. Get help right away if you: Develop a severe headache or confusion. Have unusual weakness or numbness. Feel faint. Have severe pain in your chest or abdomen. Vomit repeatedly. Have trouble breathing. These symptoms may be an emergency. Get help right away. Call 911. Do not wait to see if the symptoms will go away. Do not drive yourself to the hospital. Summary Hypertension is when the force of blood pumping through your arteries is too strong. If this condition is not controlled, it may put you at risk for serious complications. Your personal target blood pressure may vary depending on your medical conditions, your age, and other factors. For most people, a normal blood pressure is less than 120/80. Hypertension is treated with lifestyle changes, medicines, or a combination of both. Lifestyle changes include losing weight, eating a healthy,   low-sodium diet, exercising more, and limiting alcohol. This information is not intended to replace advice given to you by your health care provider. Make sure you discuss any questions you have with your health care provider. Document Revised: 05/08/2021 Document Reviewed: 05/08/2021 Elsevier Patient Education  [place].   Take magnesium at least 250 mg with evening meal to help with feelings of anxiety, blood pressure, sleep.

## 2022-06-25 LAB — LIPID PANEL
Chol/HDL Ratio: 2.8 ratio (ref 0.0–4.4)
Cholesterol, Total: 181 mg/dL (ref 100–199)
HDL: 65 mg/dL (ref >39)
LDL Chol Calc (NIH): 106 mg/dL — ABNORMAL HIGH (ref 0–99)
Triglycerides: 53 mg/dL (ref 0–149)
VLDL Cholesterol Cal: 10 mg/dL (ref 5–40)

## 2022-06-25 LAB — BMP8+EGFR
BUN/Creatinine Ratio: 15 (ref 12–28)
BUN: 12 mg/dL (ref 8–27)
CO2: 24 mmol/L (ref 20–29)
Calcium: 10.3 mg/dL (ref 8.7–10.3)
Chloride: 106 mmol/L (ref 96–106)
Creatinine, Ser: 0.8 mg/dL (ref 0.57–1.00)
Glucose: 95 mg/dL (ref 70–99)
Potassium: 4.5 mmol/L (ref 3.5–5.2)
Sodium: 144 mmol/L (ref 134–144)
eGFR: 81 mL/min/{1.73_m2} (ref >59)

## 2022-06-25 LAB — THYROID PANEL WITH TSH
Free Thyroxine Index: 2.5 (ref 1.2–4.9)
T3 Uptake Ratio: 27 % (ref 24–39)
T4, Total: 9.1 ug/dL (ref 4.5–12.0)
TSH: 2.11 u[IU]/mL (ref 0.450–4.500)

## 2022-06-25 LAB — IRON,TIBC AND FERRITIN PANEL
Ferritin: 134 ng/mL (ref 15–150)
Iron Saturation: 20 % (ref 15–55)
Iron: 63 ug/dL (ref 27–139)
Total Iron Binding Capacity: 318 ug/dL (ref 250–450)
UIBC: 255 ug/dL (ref 118–369)

## 2022-06-25 LAB — VITAMIN B12: Vitamin B-12: 701 pg/mL (ref 232–1245)

## 2022-10-12 ENCOUNTER — Other Ambulatory Visit: Payer: Self-pay | Admitting: Nurse Practitioner

## 2022-10-12 DIAGNOSIS — E039 Hypothyroidism, unspecified: Secondary | ICD-10-CM

## 2022-10-12 DIAGNOSIS — E7800 Pure hypercholesterolemia, unspecified: Secondary | ICD-10-CM

## 2022-10-24 ENCOUNTER — Ambulatory Visit: Payer: Medicare PPO | Admitting: Nurse Practitioner

## 2022-10-24 ENCOUNTER — Encounter: Payer: Self-pay | Admitting: Nurse Practitioner

## 2022-10-24 VITALS — BP 122/62 | HR 102 | Temp 98.2°F | Ht 59.0 in | Wt 127.0 lb

## 2022-10-24 DIAGNOSIS — R634 Abnormal weight loss: Secondary | ICD-10-CM

## 2022-10-24 DIAGNOSIS — I10 Essential (primary) hypertension: Secondary | ICD-10-CM

## 2022-10-24 DIAGNOSIS — Z6825 Body mass index (BMI) 25.0-25.9, adult: Secondary | ICD-10-CM

## 2022-10-24 DIAGNOSIS — E039 Hypothyroidism, unspecified: Secondary | ICD-10-CM | POA: Diagnosis not present

## 2022-10-24 NOTE — Patient Instructions (Signed)
Go for a Chest Xray at [address]

## 2022-10-24 NOTE — Progress Notes (Signed)
  I,Mozel Burdett H Octavio Matheney,acting as a Neurosurgeon for Arnette Felts, FNP.,have documented all relevant documentation on the behalf of Arnette Felts, FNP,as directed by  Arnette Felts, FNP while in the presence of Arnette Felts, FNP.    Subjective:     Patient ID: Tamara West , female    DOB: 01-21-1956 , 67 y.o.   MRN: [id number]   Chief Complaint  Patient presents with   Medical Management of Chronic Issues    HPI  Patient presents today for hypertension follow up. Patient is concerned about losing too much weight, she would like to have her thyroid levels checked; she does not see Endo. She also reports increased fatigue; she denies edema, shortness of breath or other concerns. Patient declines further COVID vaccines at this time.        Past Medical History:  Diagnosis Date   Allergy    seasonal   Anxiety    Depression    GERD (gastroesophageal reflux disease)    Hyperlipidemia    Hypertension    Sleep apnea    mild, was not put on CPAP   Thyroid disease      Family History  Problem Relation Age of Onset   Stroke Father    Hypertension Sister    Hypertension Brother    Colon cancer Neg Hx    Breast cancer Neg Hx    Colon polyps Neg Hx    Esophageal cancer Neg Hx    Stomach cancer Neg Hx    Rectal cancer Neg Hx      Current Outpatient Medications:    ASPIRIN 81 PO, Take by mouth. Take one tablet daily, Disp: , Rfl:    levothyroxine (SYNTHROID) 25 MCG tablet, TAKE 1 TABLET BY MOUTH EVERY DAY BEFORE BREAKFAST, Disp: 90 tablet, Rfl: 1   olmesartan (BENICAR) 20 MG tablet, TAKE 1 TABLET BY MOUTH EVERY DAY, Disp: 90 tablet, Rfl: 1   simvastatin (ZOCOR) 20 MG tablet, TAKE 1 TABLET BY MOUTH DAILY AT 6 PM., Disp: 90 tablet, Rfl: 1   VITAMIN D PO, Take by mouth. Take 1 tablet daily, Disp: , Rfl:    Allergies  Allergen Reactions   Penicillins Swelling   Sulfa Antibiotics Other (See Comments)    knots   Influenza Vaccines Cough    Patient states it makes her cough for weeks.      Review of Systems  Constitutional:  Positive for fatigue.  All other systems reviewed and are negative.    There were no vitals filed for this visit. There is no height or weight on file to calculate BMI.   Objective:  Physical Exam      Assessment And Plan:     There are no diagnoses linked to this encounter.    Patient was given opportunity to ask questions. Patient verbalized understanding of the plan and was able to repeat key elements of the plan. All questions were answered to their satisfaction.  Regis Bill, CMA   I, Regis Bill, CMA, have reviewed all documentation for this visit. The documentation on 10/24/22 for the exam, diagnosis, procedures, and orders are all accurate and complete.   IF YOU HAVE BEEN REFERRED TO A SPECIALIST, IT MAY TAKE 1-2 WEEKS TO SCHEDULE/PROCESS THE REFERRAL. IF YOU HAVE NOT HEARD FROM US/SPECIALIST IN TWO WEEKS, PLEASE GIVE [place] A CALL AT [phone].   THE PATIENT IS ENCOURAGED TO PRACTICE SOCIAL DISTANCING DUE TO THE COVID-19 PANDEMIC.

## 2022-10-24 NOTE — Progress Notes (Signed)
Subjective:     Patient ID: Tamara West , female    DOB: 13-Feb-1956 , 67 y.o.   MRN: [id number]   Chief Complaint  Patient presents with   Medical Management of Chronic Issues    HPI  Patient presents to the clinic for blood pressure follow up. She is also concerned about her weight loss. She has concerns that she is losing weight too fast. She reports feeling tired and overall not feeling good. Denies chills, fever, cough, or night sweats. Denies edema in lower extremities.   She admits to eating two meals daily no pork or sweets. She does not eat in the evening. She is on a regular diet. She eats proteins (chicken, beef), vegetables and starches. She reports walking for 5 days/wk for a minium of 30 min, but reduced walking to every other day due to rapid weight loss. Before all the weight loss she was eating 3 meals a day, eating pork, was not walking.   She admits having anxiety when coming to the doctor. She is concerned about her weight because family members and friends are noticing her rapid weight reduction. She wants to be sure her lab values are within normal range specifically thyroid.  She reports taking Aspirin every other day instead of daily per doctors orders. She adheres to medication regimen,. However, she wants to be sure her thyriod problem has not been overcorrected. She endorses increased anxiety and heart rate. However, denies heat intolerance and sleep disturbances.      Past Medical History:  Diagnosis Date   Allergy    seasonal   Anxiety    Depression    GERD (gastroesophageal reflux disease)    Hyperlipidemia    Hypertension    Sleep apnea    mild, was not put on CPAP   Thyroid disease      Family History  Problem Relation Age of Onset   Stroke Father    Hypertension Sister    Hypertension Brother    Colon cancer Neg Hx    Breast cancer Neg Hx    Colon polyps Neg Hx    Esophageal cancer Neg Hx    Stomach cancer Neg Hx    Rectal cancer  Neg Hx      Current Outpatient Medications:    ASPIRIN 81 PO, Take by mouth. Take one tablet daily, Disp: , Rfl:    levothyroxine (SYNTHROID) 25 MCG tablet, TAKE 1 TABLET BY MOUTH EVERY DAY BEFORE BREAKFAST, Disp: 90 tablet, Rfl: 1   olmesartan (BENICAR) 20 MG tablet, TAKE 1 TABLET BY MOUTH EVERY DAY, Disp: 90 tablet, Rfl: 1   simvastatin (ZOCOR) 20 MG tablet, TAKE 1 TABLET BY MOUTH DAILY AT 6 PM., Disp: 90 tablet, Rfl: 1   VITAMIN D PO, Take by mouth. Take 1 tablet daily, Disp: , Rfl:    Allergies  Allergen Reactions   Penicillins Swelling   Sulfa Antibiotics Other (See Comments)    knots   Influenza Vaccines Cough    Patient states it makes her cough for weeks.     Review of Systems  Constitutional: Negative.   Respiratory: Negative.    Cardiovascular: Negative.   Neurological: Negative.   Psychiatric/Behavioral: Negative.       Today's Vitals   10/24/22 1056  BP: 122/62  Pulse: (!) 102  Temp: 98.2 F (36.8 C)  TempSrc: Oral  SpO2: 99%  Weight: 127 lb (57.6 kg)  Height: 4\' 11"  (1.499 m)   Body mass index  is 25.65 kg/m.   Objective:  Physical Exam Constitutional:      Appearance: Normal appearance. She is normal weight.  HENT:     Head: Normocephalic.  Cardiovascular:     Rate and Rhythm: Regular rhythm. Tachycardia present.     Pulses: Normal pulses.     Heart sounds: Normal heart sounds, S1 normal and S2 normal.  Pulmonary:     Effort: Pulmonary effort is normal. No respiratory distress.     Breath sounds: Normal breath sounds. No wheezing.  Neurological:     General: No focal deficit present.     Mental Status: She is alert and oriented to person, place, and time.         Assessment And Plan:     1. Essential (primary) hypertension Comments: Blood pressure is well controlled. Continue current medications - CMP14+EGFR  2. Hypothyroidism, unspecified type Comments: Controlled, continue current medications - TSH + free T4  3. BMI  25.0-25.9,adult  4. Abnormal weight loss Comments: Explained again that she has been eating a healthier diet and this may be attributing to her weight loss, will refer to GI at her request for evaluation - Hemoglobin A1c - Ambulatory referral to Gastroenterology - DG Chest 2 View; Future     Patient was given opportunity to ask questions. Patient verbalized understanding of the plan and was able to repeat key elements of the plan. All questions were answered to their satisfaction.  Arnette FeltsJanece Lamaj Metoyer, FNP   I, Arnette FeltsJanece Jovee Dettinger, FNP, have reviewed all documentation for this visit. The documentation on 11/04/22 for the exam, diagnosis, procedures, and orders are all accurate and complete.   IF YOU HAVE BEEN REFERRED TO A SPECIALIST, IT MAY TAKE 1-2 WEEKS TO SCHEDULE/PROCESS THE REFERRAL. IF YOU HAVE NOT HEARD FROM US/SPECIALIST IN TWO WEEKS, PLEASE GIVE US A CALL AT [phone].   THE PATIENT IS ENCOURAGED TO PRACTICE SOCIAL DISTANCING DUE TO THE COVID-19 PANDEMIC.

## 2022-10-25 LAB — CMP14+EGFR
ALT: 14 IU/L (ref 0–32)
AST: 20 IU/L (ref 0–40)
Albumin/Globulin Ratio: 1.5 (ref 1.2–2.2)
Albumin: 4.8 g/dL (ref 3.9–4.9)
Alkaline Phosphatase: 102 IU/L (ref 44–121)
BUN/Creatinine Ratio: 15 (ref 12–28)
BUN: 15 mg/dL (ref 8–27)
Bilirubin Total: 0.3 mg/dL (ref 0.0–1.2)
CO2: 24 mmol/L (ref 20–29)
Calcium: 10.9 mg/dL — ABNORMAL HIGH (ref 8.7–10.3)
Chloride: 102 mmol/L (ref 96–106)
Creatinine, Ser: 0.97 mg/dL (ref 0.57–1.00)
Globulin, Total: 3.3 g/dL (ref 1.5–4.5)
Glucose: 108 mg/dL — ABNORMAL HIGH (ref 70–99)
Potassium: 4.7 mmol/L (ref 3.5–5.2)
Sodium: 141 mmol/L (ref 134–144)
Total Protein: 8.1 g/dL (ref 6.0–8.5)
eGFR: 64 mL/min/{1.73_m2} (ref >59)

## 2022-10-25 LAB — TSH+FREE T4
Free T4: 1.43 ng/dL (ref 0.82–1.77)
TSH: 2.02 u[IU]/mL (ref 0.450–4.500)

## 2022-10-25 LAB — HEMOGLOBIN A1C
Est. average glucose Bld gHb Est-mCnc: 120 mg/dL
Hgb A1c MFr Bld: 5.8 % — ABNORMAL HIGH (ref 4.8–5.6)

## 2022-12-08 ENCOUNTER — Other Ambulatory Visit: Payer: Self-pay | Admitting: Nurse Practitioner

## 2023-01-02 ENCOUNTER — Encounter: Payer: Self-pay | Admitting: Physician Assistant

## 2023-01-02 ENCOUNTER — Ambulatory Visit: Payer: Medicare PPO | Admitting: Physician Assistant

## 2023-01-02 VITALS — BP 126/68 | HR 78 | Ht 60.0 in | Wt 126.0 lb

## 2023-01-02 DIAGNOSIS — K5900 Constipation, unspecified: Secondary | ICD-10-CM | POA: Diagnosis not present

## 2023-01-02 DIAGNOSIS — K219 Gastro-esophageal reflux disease without esophagitis: Secondary | ICD-10-CM | POA: Diagnosis not present

## 2023-01-02 DIAGNOSIS — R634 Abnormal weight loss: Secondary | ICD-10-CM

## 2023-01-02 NOTE — Patient Instructions (Addendum)
Please purchase the following medications over the counter and take as directed: Benefiber  Elevate head of bed at night  Follow up as needed  _______________________________________________________  If your blood pressure at your visit was 140/90 or greater, please contact your primary care physician to follow up on this.  _______________________________________________________  If you are age 68 or older, your body mass index should be between 23-30. Your Body mass index is 24.61 kg/m. If this is out of the aforementioned range listed, please consider follow up with your Primary Care Provider.  If you are age 15 or younger, your body mass index should be between 19-25. Your Body mass index is 24.61 kg/m. If this is out of the aformentioned range listed, please consider follow up with your Primary Care Provider.   ________________________________________________________  The [facility] providers would like to encourage you to use [facility] to communicate with providers for non-urgent requests or questions.  Due to long hold times on the telephone, sending your provider a message by [facility] may be a faster and more efficient way to get a response.  Please allow 48 business hours for a response.  Please remember that this is for non-urgent requests.  _______________________________________________________   Thank you for entrusting me with your care and choosing [facility].  Hyacinth Meeker PA-C

## 2023-01-02 NOTE — Progress Notes (Signed)
Chief Complaint: Weight loss, GERD and change in bowel habits  HPI:    Tamara West is a 67 year old African-American female known to Dr. Russella Dar, who was referred to me by Arnette Felts, FNP for a complaint of weight loss, GERD and change in bowel habits.    10/03/2020 colonoscopy was normal.  Repeat recommended in 7 years.    10/24/2022 CMP normal other than glucose minimally elevated at 108, normal TSH and normal hemoglobin.    Today, patient presents to clinic and tells me that initially she was concerned about dramatic weight loss.  Back in December we have a documented weight of 140 pounds, then in April 127, but today she remains at around 126.  Tells me that she did back off to only 2 meals a day and drastically altered her diet to healthier eating and started walking 5 days a week and drinking lots of water but was concerned that she was losing weight too fast.  Over the past 2 months this has settled and she is not losing any further weight.  She is not as concerned about this anymore and thinks it may have been related to her dieting.    Does mention some reflux symptoms which occurs about 2 nights a week when she lays down to go to sleep.  Her last meal the day is around 2 PM and she typically does not snack in the evening, she has elevated the head of her bed occasionally and this does help.  Otherwise she will get up and drink some Gaviscon and the symptoms go away.  No dysphagia.    Also discusses a change in bowel habits, apparently seeing little balls more often than regular stools since changing her diet.  She drinks a lot of water and walks daily.    Denies fever, chills, blood in her stool, nausea or vomiting.  Past Medical History:  Diagnosis Date   Allergy    seasonal   Anxiety    Depression    GERD (gastroesophageal reflux disease)    Hyperlipidemia    Hypertension    Sleep apnea    mild, was not put on CPAP   Thyroid disease     Past Surgical History:  Procedure  Laterality Date   ABDOMINAL HYSTERECTOMY     COLONOSCOPY  2004   w/Dr.Mann=hemorrhoid    POLYPECTOMY      Current Outpatient Medications  Medication Sig Dispense Refill   ASPIRIN 81 PO Take by mouth. Take one tablet daily     levothyroxine (SYNTHROID) 25 MCG tablet TAKE 1 TABLET BY MOUTH EVERY DAY BEFORE BREAKFAST 90 tablet 1   olmesartan (BENICAR) 20 MG tablet TAKE 1 TABLET BY MOUTH EVERY DAY 90 tablet 1   simvastatin (ZOCOR) 20 MG tablet TAKE 1 TABLET BY MOUTH DAILY AT 6 PM. 90 tablet 1   VITAMIN D PO Take by mouth. Take 1 tablet daily     No current facility-administered medications for this visit.    Allergies as of 01/02/2023 - Review Complete 01/02/2023  Allergen Reaction Noted   Penicillins Swelling 01/21/2012   Sulfa antibiotics Other (See Comments) 01/21/2012   Influenza vaccines Cough 04/17/2021    Family History  Problem Relation Age of Onset   Stroke Father    Hypertension Sister    Hypertension Brother    Colon cancer Neg Hx    Breast cancer Neg Hx    Colon polyps Neg Hx    Esophageal cancer Neg Hx  Stomach cancer Neg Hx    Rectal cancer Neg Hx     Social History   Socioeconomic History   Marital status: Widowed    Spouse name: Not on file   Number of children: 4   Years of education: 11   Highest education level: Not on file  Occupational History   Occupation: Facilities manager: [employer]  Tobacco Use   Smoking status: Former    Packs/day: 0.50    Years: 3.00    Additional pack years: 0.00    Total pack years: 1.50    Types: Cigarettes    Quit date: 1989    Years since quitting: 35.4   Smokeless tobacco: Never   Tobacco comments:    Quit 1989  Vaping Use   Vaping Use: Never used  Substance and Sexual Activity   Alcohol use: No    Alcohol/week: 0.0 standard drinks of alcohol   Drug use: No   Sexual activity: Not Currently  Other Topics Concern   Not on file  Social History Narrative   Occasionally drinks soda   Social  Determinants of Health   Financial Resource Strain: Low Risk  (04/24/2022)   Overall Financial Resource Strain (CARDIA)    Difficulty of Paying Living Expenses: Not hard at all  Food Insecurity: No Food Insecurity (04/24/2022)   Hunger Vital Sign    Worried About Running Out of Food in the Last Year: Never true    Ran Out of Food in the Last Year: Never true  Transportation Needs: No Transportation Needs (04/24/2022)   PRAPARE - Administrator, Civil Service (Medical): No    Lack of Transportation (Non-Medical): No  Physical Activity: Sufficiently Active (04/24/2022)   Exercise Vital Sign    Days of Exercise per Week: 5 days    Minutes of Exercise per Session: 60 min  Stress: No Stress Concern Present (04/24/2022)   [employer] of Occupational Health - Occupational Stress Questionnaire    Feeling of Stress : Not at all  Social Connections: Moderately Isolated (04/17/2021)   Social Connection and Isolation Panel [NHANES]    Frequency of Communication with Friends and Family: More than three times a week    Frequency of Social Gatherings with Friends and Family: Once a week    Attends Religious Services: More than 4 times per year    Active Member of [employer] or Organizations: No    Attends Banker Meetings: Not on file    Marital Status: Widowed  Intimate Partner Violence: Unknown (04/17/2021)   Humiliation, Afraid, Rape, and Kick questionnaire    Fear of Current or Ex-Partner: No    Emotionally Abused: Not on file    Physically Abused: Not on file    Sexually Abused: Not on file    Review of Systems:    Constitutional: No fever or chills Cardiovascular: No chest pain Respiratory: No SOB  Gastrointestinal: See HPI and otherwise negative   Physical Exam:  Vital signs: BP 126/68   Pulse 78   Ht 5' (1.524 m)   Wt 126 lb (57.2 kg)   BMI 24.61 kg/m   Constitutional:   Pleasant AA female appears to be in NAD, Well developed, Well nourished, alert  and cooperative Respiratory: Respirations even and unlabored. Lungs clear to auscultation bilaterally.   No wheezes, crackles, or rhonchi.  Cardiovascular: Normal S1, S2. No MRG. Regular rate and rhythm. No peripheral edema, cyanosis or pallor.  Gastrointestinal:  Soft, nondistended,  nontender. No rebound or guarding. Normal bowel sounds. No appreciable masses or hepatomegaly. Rectal:  Not performed.  Psychiatric: Oriented to person, place and time. Demonstrates good judgement and reason without abnormal affect or behaviors.  RELEVANT LABS AND IMAGING: CBC    Component Value Date/Time   WBC 6.6 12/11/2021 1501   WBC 9.8 11/06/2014 1009   RBC 3.87 12/11/2021 1501   RBC 4.50 11/06/2014 1009   HGB 11.3 12/11/2021 1501   HCT 34.6 12/11/2021 1501   PLT 229 12/11/2021 1501   MCV 89 12/11/2021 1501   MCH 29.2 12/11/2021 1501   MCH 28.6 11/06/2014 1009   MCHC 32.7 12/11/2021 1501   MCHC 32.9 11/06/2014 1009   RDW 12.5 12/11/2021 1501    CMP     Component Value Date/Time   NA 141 10/24/2022 1201   K 4.7 10/24/2022 1201   CL 102 10/24/2022 1201   CO2 24 10/24/2022 1201   GLUCOSE 108 (H) 10/24/2022 1201   GLUCOSE 97 11/06/2014 1009   BUN 15 10/24/2022 1201   CREATININE 0.97 10/24/2022 1201   CREATININE 0.92 11/06/2014 1009   CALCIUM 10.9 (H) 10/24/2022 1201   PROT 8.1 10/24/2022 1201   ALBUMIN 4.8 10/24/2022 1201   AST 20 10/24/2022 1201   ALT 14 10/24/2022 1201   ALKPHOS 102 10/24/2022 1201   BILITOT 0.3 10/24/2022 1201   GFRNONAA 70 07/10/2020 1237   GFRAA 81 07/10/2020 1237    Assessment: 1.  Weight loss: Lost about 13 pounds over 4 months with a change in diet and increased exercise; most likely related to exercise and diet 2.  GERD: Symptoms 2 nights a week helped by Gaviscon; may be related to her empty stomach time of bed/gastritis 3.  Change in bowel habits: Towards constipation, recent colonoscopy in 2022 was normal; they related to decreased intake and vitamin D  supplementation  Plan: 1.  Reassured the patient.  She is no longer losing dramatic amounts of weight.  Likely this all was a result from her change in diet and exercise.  She is not worried about this anymore. 2.  Discussed reflux symptoms which are only 2 nights a week and helped by Gaviscon.  Discussed lifestyle modifications including elevating the head of her bed which may help her. 3.  Also discussed change in bowel habits, apparently more constipated with decreased intake, recommend she start a fiber supplement such as Benefiber 1-2 times daily. 4.  Patient to follow clinic with [place] as needed.  Hyacinth Meeker, PA-C [facility] 01/02/2023, 9:38 AM  Cc: Arnette Felts, FNP

## 2023-02-27 DIAGNOSIS — R7309 Other abnormal glucose: Secondary | ICD-10-CM | POA: Diagnosis not present

## 2023-02-27 DIAGNOSIS — Z79899 Other long term (current) drug therapy: Secondary | ICD-10-CM | POA: Diagnosis not present

## 2023-02-27 DIAGNOSIS — E210 Primary hyperparathyroidism: Secondary | ICD-10-CM | POA: Diagnosis not present

## 2023-02-27 DIAGNOSIS — E785 Hyperlipidemia, unspecified: Secondary | ICD-10-CM | POA: Diagnosis not present

## 2023-02-27 DIAGNOSIS — E039 Hypothyroidism, unspecified: Secondary | ICD-10-CM | POA: Diagnosis not present

## 2023-02-27 DIAGNOSIS — Z1159 Encounter for screening for other viral diseases: Secondary | ICD-10-CM | POA: Diagnosis not present

## 2023-02-27 DIAGNOSIS — I10 Essential (primary) hypertension: Secondary | ICD-10-CM | POA: Diagnosis not present

## 2023-03-07 DIAGNOSIS — R7309 Other abnormal glucose: Secondary | ICD-10-CM | POA: Diagnosis not present

## 2023-03-07 DIAGNOSIS — E785 Hyperlipidemia, unspecified: Secondary | ICD-10-CM | POA: Diagnosis not present

## 2023-03-07 DIAGNOSIS — Z0001 Encounter for general adult medical examination with abnormal findings: Secondary | ICD-10-CM | POA: Diagnosis not present

## 2023-03-07 DIAGNOSIS — E210 Primary hyperparathyroidism: Secondary | ICD-10-CM | POA: Diagnosis not present

## 2023-03-07 DIAGNOSIS — I10 Essential (primary) hypertension: Secondary | ICD-10-CM | POA: Diagnosis not present

## 2023-03-10 ENCOUNTER — Other Ambulatory Visit: Payer: Self-pay | Admitting: Nurse Practitioner

## 2023-03-10 DIAGNOSIS — Z1231 Encounter for screening mammogram for malignant neoplasm of breast: Secondary | ICD-10-CM

## 2023-04-11 ENCOUNTER — Ambulatory Visit
Admission: RE | Admit: 2023-04-11 | Discharge: 2023-04-11 | Disposition: A | Discharge status: Home / Self Care | Payer: Medicare PPO | Source: Ambulatory Visit | Attending: Nurse Practitioner | Admitting: Nurse Practitioner

## 2023-04-11 DIAGNOSIS — Z1231 Encounter for screening mammogram for malignant neoplasm of breast: Secondary | ICD-10-CM

## 2023-05-08 ENCOUNTER — Ambulatory Visit: Payer: Self-pay | Admitting: Nurse Practitioner

## 2023-05-08 NOTE — Progress Notes (Deleted)
Madelaine Bhat, CMA,acting as a Neurosurgeon for Arnette Felts, FNP.,have documented all relevant documentation on the behalf of Arnette Felts, FNP,as directed by  Arnette Felts, FNP while in the presence of Arnette Felts, FNP.  Subjective:  Patient ID: Tamara West , female    DOB: 03/27/1956 , 67 y.o.   MRN: [id number]  No chief complaint on file.   HPI  Patient presents today for a bp and thyroid follow up, Patient reports compliance with medication. Patient denies any chest pain, SOB, or headaches. Patient has no concerns today.     Past Medical History:  Diagnosis Date  . Allergy    seasonal  . Anxiety   . Depression   . GERD (gastroesophageal reflux disease)   . Hyperlipidemia   . Hypertension   . Sleep apnea    mild, was not put on CPAP  . Thyroid disease      Family History  Problem Relation Age of Onset  . Stroke Father   . Hypertension Sister   . Hypertension Brother   . Colon cancer Neg Hx   . Breast cancer Neg Hx   . Colon polyps Neg Hx   . Esophageal cancer Neg Hx   . Stomach cancer Neg Hx   . Rectal cancer Neg Hx      Current Outpatient Medications:  .  ASPIRIN 81 PO, Take by mouth. Take one tablet daily, Disp: , Rfl:  .  levothyroxine (SYNTHROID) 25 MCG tablet, TAKE 1 TABLET BY MOUTH EVERY DAY BEFORE BREAKFAST, Disp: 90 tablet, Rfl: 1 .  olmesartan (BENICAR) 20 MG tablet, TAKE 1 TABLET BY MOUTH EVERY DAY, Disp: 90 tablet, Rfl: 1 .  simvastatin (ZOCOR) 20 MG tablet, TAKE 1 TABLET BY MOUTH DAILY AT 6 PM., Disp: 90 tablet, Rfl: 1 .  VITAMIN D PO, Take by mouth. Take 1 tablet daily, Disp: , Rfl:    Allergies  Allergen Reactions  . Penicillins Swelling  . Sulfa Antibiotics Other (See Comments)    knots  . Influenza Vaccines Cough    Patient states it makes her cough for weeks.     Review of Systems   There were no vitals filed for this visit. There is no height or weight on file to calculate BMI.  Wt Readings from Last 3 Encounters:  01/02/23 126 lb  (57.2 kg)  10/24/22 127 lb (57.6 kg)  06/24/22 140 lb (63.5 kg)    The 10-year ASCVD risk score (Arnett DK, et al., 2019) is: 8.9%   Values used to calculate the score:     Age: 70 years     Sex: Female     Is Non-Hispanic African American: Yes     Diabetic: No     Tobacco smoker: No     Systolic Blood Pressure: 126 mmHg     Is BP treated: Yes     HDL Cholesterol: 65 mg/dL     Total Cholesterol: 181 mg/dL  Objective:  Physical Exam      Assessment And Plan:  Essential (primary) hypertension  Hypothyroidism, unspecified type  Elevated cholesterol    No follow-ups on file.  Patient was given opportunity to ask questions. Patient verbalized understanding of the plan and was able to repeat key elements of the plan. All questions were answered to their satisfaction.    Jeanell Sparrow, FNP, have reviewed all documentation for this visit. The documentation on 05/08/23 for the exam, diagnosis, procedures, and orders are all accurate and complete.  IF YOU HAVE BEEN REFERRED TO A SPECIALIST, IT MAY TAKE 1-2 WEEKS TO SCHEDULE/PROCESS THE REFERRAL. IF YOU HAVE NOT HEARD FROM US/SPECIALIST IN TWO WEEKS, PLEASE GIVE [place] A CALL AT [phone].

## 2023-06-02 DIAGNOSIS — I70203 Unspecified atherosclerosis of native arteries of extremities, bilateral legs: Secondary | ICD-10-CM | POA: Diagnosis not present

## 2023-06-02 DIAGNOSIS — E039 Hypothyroidism, unspecified: Secondary | ICD-10-CM | POA: Diagnosis not present

## 2023-06-02 DIAGNOSIS — Z79899 Other long term (current) drug therapy: Secondary | ICD-10-CM | POA: Diagnosis not present

## 2023-06-02 DIAGNOSIS — E785 Hyperlipidemia, unspecified: Secondary | ICD-10-CM | POA: Diagnosis not present

## 2023-06-02 DIAGNOSIS — I10 Essential (primary) hypertension: Secondary | ICD-10-CM | POA: Diagnosis not present

## 2023-06-13 DIAGNOSIS — I70203 Unspecified atherosclerosis of native arteries of extremities, bilateral legs: Secondary | ICD-10-CM | POA: Diagnosis not present

## 2023-09-01 DIAGNOSIS — Z79899 Other long term (current) drug therapy: Secondary | ICD-10-CM | POA: Diagnosis not present

## 2023-09-01 DIAGNOSIS — Z139 Encounter for screening, unspecified: Secondary | ICD-10-CM | POA: Diagnosis not present

## 2023-09-01 DIAGNOSIS — I10 Essential (primary) hypertension: Secondary | ICD-10-CM | POA: Diagnosis not present

## 2023-09-01 DIAGNOSIS — E785 Hyperlipidemia, unspecified: Secondary | ICD-10-CM | POA: Diagnosis not present

## 2023-09-01 DIAGNOSIS — Z136 Encounter for screening for cardiovascular disorders: Secondary | ICD-10-CM | POA: Diagnosis not present

## 2023-09-01 DIAGNOSIS — E039 Hypothyroidism, unspecified: Secondary | ICD-10-CM | POA: Diagnosis not present

## 2023-12-03 DIAGNOSIS — Z79899 Other long term (current) drug therapy: Secondary | ICD-10-CM | POA: Diagnosis not present

## 2023-12-03 DIAGNOSIS — T466X5A Adverse effect of antihyperlipidemic and antiarteriosclerotic drugs, initial encounter: Secondary | ICD-10-CM | POA: Diagnosis not present

## 2023-12-03 DIAGNOSIS — E785 Hyperlipidemia, unspecified: Secondary | ICD-10-CM | POA: Diagnosis not present

## 2023-12-03 DIAGNOSIS — I10 Essential (primary) hypertension: Secondary | ICD-10-CM | POA: Diagnosis not present

## 2023-12-03 DIAGNOSIS — M791 Myalgia, unspecified site: Secondary | ICD-10-CM | POA: Diagnosis not present

## 2023-12-03 DIAGNOSIS — E039 Hypothyroidism, unspecified: Secondary | ICD-10-CM | POA: Diagnosis not present

## 2023-12-03 DIAGNOSIS — Z0001 Encounter for general adult medical examination with abnormal findings: Secondary | ICD-10-CM | POA: Diagnosis not present

## 2023-12-03 DIAGNOSIS — M8580 Other specified disorders of bone density and structure, unspecified site: Secondary | ICD-10-CM | POA: Diagnosis not present

## 2023-12-03 DIAGNOSIS — E559 Vitamin D deficiency, unspecified: Secondary | ICD-10-CM | POA: Diagnosis not present

## 2023-12-22 ENCOUNTER — Other Ambulatory Visit (HOSPITAL_BASED_OUTPATIENT_CLINIC_OR_DEPARTMENT_OTHER): Payer: Self-pay | Admitting: Registered Nurse

## 2023-12-22 DIAGNOSIS — Z780 Asymptomatic menopausal state: Secondary | ICD-10-CM

## 2024-01-06 ENCOUNTER — Other Ambulatory Visit: Payer: Self-pay | Admitting: Registered Nurse

## 2024-01-06 ENCOUNTER — Ambulatory Visit
Admission: RE | Admit: 2024-01-06 | Discharge: 2024-01-06 | Disposition: A | Discharge status: Home / Self Care | Source: Ambulatory Visit | Attending: Registered Nurse | Admitting: Registered Nurse

## 2024-01-06 DIAGNOSIS — R053 Chronic cough: Secondary | ICD-10-CM

## 2024-01-08 ENCOUNTER — Other Ambulatory Visit: Payer: Self-pay | Admitting: Registered Nurse

## 2024-01-08 DIAGNOSIS — R053 Chronic cough: Secondary | ICD-10-CM

## 2024-01-15 ENCOUNTER — Inpatient Hospital Stay
Admission: RE | Admit: 2024-01-15 | Discharge: 2024-01-15 | Discharge status: Home / Self Care | Source: Ambulatory Visit | Attending: Registered Nurse

## 2024-01-15 DIAGNOSIS — R053 Chronic cough: Secondary | ICD-10-CM

## 2024-01-30 ENCOUNTER — Encounter: Payer: Self-pay | Admitting: Advanced Practice Midwife

## 2024-03-08 ENCOUNTER — Other Ambulatory Visit: Payer: Self-pay | Admitting: Registered Nurse

## 2024-03-08 DIAGNOSIS — Z1231 Encounter for screening mammogram for malignant neoplasm of breast: Secondary | ICD-10-CM

## 2024-04-13 ENCOUNTER — Ambulatory Visit
Admission: RE | Admit: 2024-04-13 | Discharge: 2024-04-13 | Disposition: A | Discharge status: Home / Self Care | Source: Ambulatory Visit | Attending: Registered Nurse | Admitting: Registered Nurse

## 2024-04-13 DIAGNOSIS — Z1231 Encounter for screening mammogram for malignant neoplasm of breast: Secondary | ICD-10-CM

## 2024-04-28 ENCOUNTER — Ambulatory Visit (HOSPITAL_BASED_OUTPATIENT_CLINIC_OR_DEPARTMENT_OTHER)
Admission: RE | Admit: 2024-04-28 | Discharge: 2024-04-28 | Disposition: A | Discharge status: Home / Self Care | Source: Ambulatory Visit | Attending: Registered Nurse | Admitting: Registered Nurse

## 2024-04-28 DIAGNOSIS — Z780 Asymptomatic menopausal state: Secondary | ICD-10-CM | POA: Diagnosis present
